# Patient Record
Sex: Female | Born: 1990 | Race: Black or African American | Hispanic: No | Marital: Single | Smoking: Never smoker
Health system: Southern US, Community
[De-identification: ages and names within clinical notes are randomized; demographics above are authoritative.]

## PROBLEM LIST (undated history)

## (undated) DIAGNOSIS — N83209 Unspecified ovarian cyst, unspecified side: Secondary | ICD-10-CM

---

## 2016-01-12 ENCOUNTER — Encounter (HOSPITAL_COMMUNITY): Payer: Self-pay | Admitting: Emergency Medicine

## 2016-01-12 ENCOUNTER — Emergency Department (HOSPITAL_COMMUNITY): Payer: Self-pay

## 2016-01-12 ENCOUNTER — Emergency Department (HOSPITAL_COMMUNITY)
Admission: EM | Admit: 2016-01-12 | Discharge: 2016-01-12 | Disposition: A | Discharge status: Home / Self Care | Payer: Self-pay | Attending: Emergency Medicine | Admitting: Emergency Medicine

## 2016-01-12 DIAGNOSIS — N83291 Other ovarian cyst, right side: Secondary | ICD-10-CM | POA: Insufficient documentation

## 2016-01-12 DIAGNOSIS — N83209 Unspecified ovarian cyst, unspecified side: Secondary | ICD-10-CM

## 2016-01-12 LAB — WET PREP, GENITAL
CLUE CELLS WET PREP: NONE SEEN
SPERM: NONE SEEN
Trich, Wet Prep: NONE SEEN
YEAST WET PREP: NONE SEEN

## 2016-01-12 LAB — COMPREHENSIVE METABOLIC PANEL
ALT: 20 U/L (ref 14–54)
AST: 14 U/L — AB (ref 15–41)
Albumin: 4 g/dL (ref 3.5–5.0)
Alkaline Phosphatase: 71 U/L (ref 38–126)
Anion gap: 8 (ref 5–15)
BUN: 11 mg/dL (ref 6–20)
CHLORIDE: 108 mmol/L (ref 101–111)
CO2: 24 mmol/L (ref 22–32)
Calcium: 9 mg/dL (ref 8.9–10.3)
Creatinine, Ser: 0.64 mg/dL (ref 0.44–1.00)
GFR calc Af Amer: >60 mL/min (ref >60)
Glucose, Bld: 92 mg/dL (ref 65–99)
POTASSIUM: 3.8 mmol/L (ref 3.5–5.1)
Sodium: 140 mmol/L (ref 135–145)
Total Bilirubin: 0.5 mg/dL (ref 0.3–1.2)
Total Protein: 6.5 g/dL (ref 6.5–8.1)

## 2016-01-12 LAB — URINALYSIS, ROUTINE W REFLEX MICROSCOPIC
Bilirubin Urine: NEGATIVE
Glucose, UA: NEGATIVE mg/dL
Ketones, ur: NEGATIVE mg/dL
NITRITE: NEGATIVE
PROTEIN: NEGATIVE mg/dL
Specific Gravity, Urine: 1.009 (ref 1.005–1.030)
pH: 6 (ref 5.0–8.0)

## 2016-01-12 LAB — CBC
HEMATOCRIT: 38.4 % (ref 36.0–46.0)
HEMOGLOBIN: 12.8 g/dL (ref 12.0–15.0)
MCH: 26.6 pg (ref 26.0–34.0)
MCHC: 33.3 g/dL (ref 30.0–36.0)
MCV: 79.8 fL (ref 78.0–100.0)
Platelets: 353 10*3/uL (ref 150–400)
RBC: 4.81 MIL/uL (ref 3.87–5.11)
RDW: 14.7 % (ref 11.5–15.5)
WBC: 12.1 10*3/uL — AB (ref 4.0–10.5)

## 2016-01-12 LAB — RPR: RPR Ser Ql: NONREACTIVE

## 2016-01-12 LAB — HIV ANTIBODY (ROUTINE TESTING W REFLEX): HIV Screen 4th Generation wRfx: NONREACTIVE

## 2016-01-12 LAB — PREGNANCY, URINE: PREG TEST UR: NEGATIVE

## 2016-01-12 MED ORDER — IOPAMIDOL (ISOVUE-300) INJECTION 61%
INTRAVENOUS | Status: AC
  Administered 2016-01-12: 100 mL
  Administered 2016-01-12: 1 mL
  Filled 2016-01-12: qty 100

## 2016-01-12 NOTE — ED Triage Notes (Signed)
Pt is c/o cramping pain in her lower abdomen and on her right side  Pt states the pain is worse when she voids  Pt state pain started yesterday   Denies N/V/D

## 2016-01-12 NOTE — Discharge Instructions (Signed)
[facility]5 days for follow-up. Take Tylenol or ibuprofen as needed for pain relief. Drink plenty of fluids  Get help right away if: You have abdominal pain that is severe or gets worse. You cannot eat or drink without vomiting. You suddenly develop a fever. Your menstrual period is much heavier than usual.

## 2016-01-12 NOTE — ED Notes (Addendum)
Pt performed self swab w/ this writer's verbal assistance.

## 2016-01-12 NOTE — ED Notes (Signed)
Pt unable to tolerate pelvic exam.  Green(large) and white(small) speculums utilized.  Odor and white discharge noted.

## 2016-01-12 NOTE — ED Provider Notes (Signed)
[facility]-EMERGENCY DEPT Provider Note   CSN: [id number] Arrival date & time: 01/12/16  [id number]     History   Chief Complaint Chief Complaint  Patient presents with  . Abdominal Pain    HPI [name] is a 25 y.o. female complains of Suprapubic and right lower quadrant cramping abdominal pain since yesterday. She states that her cramping pain and is intermittent and worsening. Patient states that her pain is about a 5/10. Patient states that she has had a shooting pain that may last 10 seconds. Pain worse when she voids. Patient denies taking anything for pain. She reports last menstrual period was last week on the 12th and no recent sexual activity. Patient admits to vaginal discharge. Patient denies dysuria, urinary symptoms, nausea, vomiting, diarrhea, chest pain, shortness of breath.  Patient denies any abdominal surgeries.   Abdominal Pain   Pertinent negatives include fever, diarrhea, nausea, vomiting, dysuria, hematuria and arthralgias.    History reviewed. No pertinent past medical history.  There are no active problems to display for this patient.   History reviewed. No pertinent surgical history.  OB History    No data available       Home Medications    Prior to Admission medications   Not on File    Family History Family History  Problem Relation Age of Onset  . Cancer Other   . Hypertension Other     Social History Social History  Substance Use Topics  . Smoking status: Never Smoker  . Smokeless tobacco: Never Used  . Alcohol use Yes     Comment: occ     Allergies   Fish allergy   Review of Systems Review of Systems  Constitutional: Negative for chills and fever.  HENT: Negative for sore throat.   Eyes: Negative for pain and visual disturbance.  Respiratory: Negative for cough and shortness of breath.   Cardiovascular: Negative for chest pain and palpitations.  Gastrointestinal: Positive for abdominal pain. Negative for diarrhea, nausea and  vomiting.  Endocrine: Negative for polyuria.  Genitourinary: Negative for difficulty urinating, dysuria and hematuria.  Musculoskeletal: Negative for arthralgias and back pain.  Skin: Negative for color change, rash and wound.     Physical Exam Updated Vital Signs BP 129/87 (BP Location: Right Arm)   Pulse 78   Temp 98.6 F (37 C) (Oral)   Resp 18   Ht 5\' 7"  (1.702 m)   Wt 90.7 kg   LMP 01/01/2016 (Approximate)   SpO2 99%   BMI 31.32 kg/m   Physical Exam  Constitutional: She is oriented to person, place, and time. She appears well-developed and well-nourished.  HENT:  Head: Normocephalic and atraumatic.  Nose: Nose normal.  Eyes: Conjunctivae and EOM are normal. Pupils are equal, round, and reactive to light.  Neck: Normal range of motion. Neck supple.  Cardiovascular: Normal rate and normal heart sounds.   Pulmonary/Chest: Effort normal and breath sounds normal. No respiratory distress. She exhibits no tenderness.  Abdominal: Soft. Bowel sounds are normal. There is tenderness. There is tenderness at McBurney's point (Slight). There is no rebound, no guarding and negative Murphy's sign. Hernia confirmed negative in the right inguinal area and confirmed negative in the left inguinal area.  Soft positive rovsing sign Soft positive obturator sign Negative psoas sign  Genitourinary: Pelvic exam was performed with patient supine. No labial fusion. There is no rash, tenderness, lesion or injury on the right labia. There is no rash, tenderness, lesion or injury on the left labia.  Uterus is tender. Cervix exhibits motion tenderness. Right adnexum displays no mass and no tenderness. Left adnexum displays no mass and no tenderness. There is tenderness in the vagina. No foreign body in the vagina. Vaginal discharge found.  Genitourinary Comments: Attempted pelvic exam with normal speculum and patient was not able to tolerate. Attempted to use smaller speculum and patient still not able to  tolerate exam.  Patient had white vaginal discharge from vagina.  Was able to slightly visualize vaginal canal.  Patient extremely tender during exam. There was some white discharge noted in the vagina and scant blood possibly secondary to her movement during exam.  Patient agreed to attempt to swab swab herself, however these are not the best way for testing and are not clean samples.   Exam chaperoned.   Musculoskeletal: Normal range of motion.  Lymphadenopathy:       Right: No inguinal adenopathy present.       Left: No inguinal adenopathy present.  Neurological: She is alert and oriented to person, place, and time.  Skin: Skin is warm. Capillary refill takes less than 2 seconds. No rash noted. No erythema.  Psychiatric: She has a normal mood and affect. Her behavior is normal.  Nursing note and vitals reviewed.    ED Treatments / Results  Labs (all labs ordered are listed, but only abnormal results are displayed) Labs Reviewed  WET PREP, GENITAL - Abnormal; Notable for the following:       Result Value   WBC, Wet Prep HPF POC FEW (*)    All other components within normal limits  URINALYSIS, ROUTINE W REFLEX MICROSCOPIC - Abnormal; Notable for the following:    Color, Urine STRAW (*)    Hgb urine dipstick LARGE (*)    Leukocytes, UA TRACE (*)    Bacteria, UA RARE (*)    Squamous Epithelial / LPF 0-5 (*)    All other components within normal limits  COMPREHENSIVE METABOLIC PANEL - Abnormal; Notable for the following:    AST 14 (*)    All other components within normal limits  CBC - Abnormal; Notable for the following:    WBC 12.1 (*)    All other components within normal limits  PREGNANCY, URINE  RPR  HIV ANTIBODY (ROUTINE TESTING)  GC/CHLAMYDIA PROBE AMP ([place]) NOT AT [facility]    EKG  EKG Interpretation None       Radiology US Pelvis Complete  Result Date: 01/12/2016 CLINICAL DATA:  Acute onset right pelvic pain for 1 day. Clinical suspicion for ovarian  torsion. EXAM: TRANSABDOMINAL ULTRASOUND OF PELVIS DOPPLER ULTRASOUND OF OVARIES TECHNIQUE: Transabdominal ultrasound examination of the pelvis was performed including evaluation of the uterus, ovaries, adnexal regions, and pelvic cul-de-sac. Color and duplex Doppler ultrasound was utilized to evaluate blood flow to the ovaries. Patient refused transvaginal sonography. COMPARISON:  None. FINDINGS: Uterus Measurements: 7.4 x 3.4 x 4.4 cm. No fibroids or other mass visualized. Endometrium Thickness: 10 mm transabdominally. No focal abnormality visualized although evaluation is limited on this transabdominal only exam. Right ovary Measurements: 5.8 x 4.8 x 5.7 cm. A hypoechoic lesion with diffuse low level internal echoes and increased through transmission is seen which measures 5.0 x 3.9 x 4.6 cm. This lesion shows no internal blood flow, although some blood flow is seen within adjacent normal ovarian tissue on color Doppler ultrasound. Left ovary Measurements: 3.4 x 2.2 x 2.7 cm. Normal appearance/no adnexal mass. Pulsed Doppler evaluation demonstrates normal low-resistance arterial and venous waveforms in both  ovaries. IMPRESSION: 5 cm complex cystic lesion in right ovary, which has indeterminate but probably benign characteristics. Differential diagnosis includes hemorrhagic ovarian cyst, endometrioma, and cystic ovarian neoplasm. Recommend followup by pelvic ultrasound in 6-12 weeks. This recommendation follows the consensus statement: Management of Asymptomatic Ovarian and Other Adnexal Cysts Imaged at US: Society of Radiologists in Ultrasound Consensus Conference Statement. Radiology 2010; [phone]. No sonographic evidence for ovarian torsion. Normal appearance of uterus and left ovary. Electronically Signed   By: Myles RosenthalJohn  Stahl M.D.   On: 01/12/2016 14:03   Ct Abdomen Pelvis W Contrast  Result Date: 01/12/2016 CLINICAL DATA:  Right lower quadrant pain starting yesterday EXAM: CT ABDOMEN AND PELVIS WITH  CONTRAST TECHNIQUE: Multidetector CT imaging of the abdomen and pelvis was performed using the standard protocol following bolus administration of intravenous contrast. CONTRAST:  1 ISOVUE-300 IOPAMIDOL (ISOVUE-300) INJECTION 61% COMPARISON:  None. FINDINGS: Lower chest:  No contributory findings. Hepatobiliary: No focal liver abnormality.No evidence of biliary obstruction or stone. Pancreas: Unremarkable. Spleen: Unremarkable. Adrenals/Urinary Tract: Negative adrenals. No hydronephrosis or stone. Unremarkable bladder. Stomach/Bowel:  No obstruction. No appendicitis. Vascular/Lymphatic: No acute vascular abnormality. Narrowing of the celiac axis which is likely from median arcuate ligament. No hypertrophied peripancreatic arterial collaterals. No mass or adenopathy. Reproductive:37 mm right adnexal cyst which has density greater than water. Along the inferior margin of this cyst are strandy high-density areas which could be intravascular or clot. Neighboring ovarian parenchyma is not particularly edematous. Other: Small pelvic fluid which may be from cyst leakage. Musculoskeletal: No acute abnormalities. IMPRESSION: 1. 4 cm right ovarian cyst. Cyst complexity is likely from hemorrhage but pelvic ultrasound follow-up is recommended. 2. Small pelvic fluid implying cyst leakage. 3. Normal appendix. Electronically Signed   By: Marnee SpringJonathon  Watts M.D.   On: 01/12/2016 11:40   [place] Art/ven Flow Abd Pelv Doppler  Result Date: 01/12/2016 CLINICAL DATA:  Acute onset right pelvic pain for 1 day. Clinical suspicion for ovarian torsion. EXAM: TRANSABDOMINAL ULTRASOUND OF PELVIS DOPPLER ULTRASOUND OF OVARIES TECHNIQUE: Transabdominal ultrasound examination of the pelvis was performed including evaluation of the uterus, ovaries, adnexal regions, and pelvic cul-de-sac. Color and duplex Doppler ultrasound was utilized to evaluate blood flow to the ovaries. Patient refused transvaginal sonography. COMPARISON:  None. FINDINGS: Uterus  Measurements: 7.4 x 3.4 x 4.4 cm. No fibroids or other mass visualized. Endometrium Thickness: 10 mm transabdominally. No focal abnormality visualized although evaluation is limited on this transabdominal only exam. Right ovary Measurements: 5.8 x 4.8 x 5.7 cm. A hypoechoic lesion with diffuse low level internal echoes and increased through transmission is seen which measures 5.0 x 3.9 x 4.6 cm. This lesion shows no internal blood flow, although some blood flow is seen within adjacent normal ovarian tissue on color Doppler ultrasound. Left ovary Measurements: 3.4 x 2.2 x 2.7 cm. Normal appearance/no adnexal mass. Pulsed Doppler evaluation demonstrates normal low-resistance arterial and venous waveforms in both ovaries. IMPRESSION: 5 cm complex cystic lesion in right ovary, which has indeterminate but probably benign characteristics. Differential diagnosis includes hemorrhagic ovarian cyst, endometrioma, and cystic ovarian neoplasm. Recommend followup by pelvic ultrasound in 6-12 weeks. This recommendation follows the consensus statement: Management of Asymptomatic Ovarian and Other Adnexal Cysts Imaged at US: Society of Radiologists in Ultrasound Consensus Conference Statement. Radiology 2010; [phone]:[id number]. No sonographic evidence for ovarian torsion. Normal appearance of uterus and left ovary. Electronically Signed   By: Myles RosenthalJohn  Stahl M.D.   On: 01/12/2016 14:03    Procedures Procedures (including critical care time)  Medications  Ordered in ED Medications  iopamidol (ISOVUE-300) 61 % injection (1 mL  Contrast Given 01/12/16 1129)     Initial Impression / Assessment and Plan / ED Course  I have reviewed the triage vital signs and the nursing notes.  Pertinent labs & imaging results that were available during my care of the patient were reviewed by me and considered in my medical decision making (see chart for details).  Clinical Course   Patient is a 25 year old female presenting with RLQ pain since  yesterday. On exam patient afebrile, vital signs stable, in no apparent distress. Heart and lung sounds clear. Abdomen soft, slightly tender to right lower quadrant. Soft positive Rovsing sign, soft positive obturator sign. Negative psoas sign. Attempted pelvic exam with normal speculum and patient was not able to tolerate. Attempted to use smaller speculum and patient still not able to tolerate exam. Patient had white vaginal discharge from vagina. Was able to slightly visualize vaginal canal. Patient extremely tender during exam. There was some white discharge noted in the vagina and slight blood possibly secondary to her movement during exam. Patient agreed to attempt to swab swab herself, however these are not the best way for testing and are not clean samples.  History and physical on patient not totally convincing for appendicitis.  Will order Ct. CT shows a 4 cm right ovarian cyst and small pelvic fluid implying cyst leakage. Normal appendix. Follow-up ultrasound shows right ovarian cyst of 5 cm, no evidence of ovarian torsion, and normal left ovary. Increase in WBC not concerning for infection at this time. Patient is afebrile, VSS, and in NAD. Patient given strict instructions to follow-up with [facility] [place] today. Return percussion is given for any new or worsening symptoms such as fevers, chills, shortness of breath, chest pain, excessive bleeding, or bleeding between periods. Patient also seen and evaluated by Dr. Jacqulyn Bath.   Final Clinical Impressions(s) / ED Diagnoses   Final diagnoses:  Ruptured ovarian cyst    New Prescriptions There are no discharge medications for this patient.    [address] [place], [place] 01/12/16 1557    Maia Plan, MD 01/12/16 1958

## 2016-01-13 LAB — GC/CHLAMYDIA PROBE AMP (~~LOC~~) NOT AT ARMC
CHLAMYDIA, DNA PROBE: NEGATIVE
Neisseria Gonorrhea: NEGATIVE

## 2016-02-22 ENCOUNTER — Ambulatory Visit (INDEPENDENT_AMBULATORY_CARE_PROVIDER_SITE_OTHER): Payer: Self-pay | Admitting: Obstetrics & Gynecology

## 2016-02-22 ENCOUNTER — Encounter: Payer: Self-pay | Admitting: Obstetrics & Gynecology

## 2016-02-22 VITALS — BP 130/77 | HR 60 | Wt 196.0 lb

## 2016-02-22 DIAGNOSIS — N83201 Unspecified ovarian cyst, right side: Secondary | ICD-10-CM

## 2016-02-22 NOTE — Progress Notes (Signed)
Pt given Free Pap Screening number US scheduled for February 12th @ 1545.  Pt notified.

## 2016-02-22 NOTE — Progress Notes (Signed)
History:  26 y.o. here today for eval of pelvic pain. G0. LMP 1/12-1/167.  Pt is not sexually active. Was seen in the ED and noted to have a cyst in her right ovary. Had some pain with her cycle last month but, has noted no other changes.    The following portions of the patient's history were reviewed and updated as appropriate: allergies, current medications, past family history, past medical history, past social history, past surgical history and problem list.  Review of Systems:  Pertinent items are noted in HPI.   Objective:  Physical Exam Blood pressure 130/77, pulse 60, weight 196 lb (88.9 kg), last menstrual period 01/27/2016. Gen: NAD Lungs: CTA CV: RR  Abd: Soft, nontender and nondistended Pelvic: Normal appearing external genitalia; normal appearing vaginal mucosa and cervix.  Normal discharge.  Small uterus, no other palpable masses, no uterine or adnexal tenderness  Labs and Imaging 01/12/2016 CLINICAL DATA:  Acute onset right pelvic pain for 1 day. Clinical suspicion for ovarian torsion.  EXAM: TRANSABDOMINAL ULTRASOUND OF PELVIS  DOPPLER ULTRASOUND OF OVARIES  TECHNIQUE: Transabdominal ultrasound examination of the pelvis was performed including evaluation of the uterus, ovaries, adnexal regions, and pelvic cul-de-sac.  Color and duplex Doppler ultrasound was utilized to evaluate blood flow to the ovaries. Patient refused transvaginal sonography.  COMPARISON:  None.  FINDINGS: Uterus  Measurements: 7.4 x 3.4 x 4.4 cm. No fibroids or other mass visualized.  Endometrium  Thickness: 10 mm transabdominally. No focal abnormality visualized although evaluation is limited on this transabdominal only exam.  Right ovary  Measurements: 5.8 x 4.8 x 5.7 cm. A hypoechoic lesion with diffuse low level internal echoes and increased through transmission is seen which measures 5.0 x 3.9 x 4.6 cm. This lesion shows no internal blood flow, although some  blood flow is seen within adjacent normal ovarian tissue on color Doppler ultrasound.  Left ovary  Measurements: 3.4 x 2.2 x 2.7 cm. Normal appearance/no adnexal mass.  Pulsed Doppler evaluation demonstrates normal low-resistance arterial and venous waveforms in both ovaries.  IMPRESSION: 5 cm complex cystic lesion in right ovary, which has indeterminate but probably benign characteristics. Differential diagnosis includes hemorrhagic ovarian cyst, endometrioma, and cystic ovarian neoplasm. Recommend followup by pelvic ultrasound in 6-12 weeks. This recommendation follows the consensus statement: Management of Asymptomatic Ovarian and Other Adnexal Cysts Imaged at US: Society of Radiologists in Ultrasound Consensus Conference Statement. Radiology 2010; [phone].  No sonographic evidence for ovarian torsion.  Normal appearance of uterus and left ovary.   Assessment & Plan:  Right ov cyst  rec repeat US to check for resolution F/u prn  Janet Navarro L. Harraway-Smith, M.D., [facility] CoreFACOG

## 2016-02-22 NOTE — Patient Instructions (Signed)
Ovarian Cyst  An ovarian cyst is a fluid-filled sac that forms on an ovary. The ovaries are small organs that produce eggs in women. Various types of cysts can form on the ovaries. Some may cause symptoms and require treatment. Most ovarian cysts go away on their own, are not cancerous (are benign), and do not cause problems. Common types of ovarian cysts include:  Functional (follicle) cysts.  Occur during the menstrual cycle, and usually go away with the next menstrual cycle if you do not get pregnant.  Usually cause no symptoms.  Endometriomas.  Are cysts that form from the tissue that lines the uterus (endometrium).  Are sometimes called "chocolate cysts" because they become filled with blood that turns brown.  Can cause pain in the lower abdomen during intercourse and during your period.  Cystadenoma cysts.  Develop from cells on the outside surface of the ovary.  Can get very large and cause lower abdomen pain and pain with intercourse.  Can cause severe pain if they twist or break open (rupture).  Dermoid cysts.  Are sometimes found in both ovaries.  May contain different kinds of body tissue, such as skin, teeth, hair, or cartilage.  Usually do not cause symptoms unless they get very big.  Theca lutein cysts.  Occur when too much of a certain hormone (human chorionic gonadotropin) is produced and overstimulates the ovaries to produce an egg.  Are most common after having procedures used to assist with the conception of a baby (in vitro fertilization). What are the causes? Ovarian cysts may be caused by:  Ovarian hyperstimulation syndrome. This is a condition that can develop from taking fertility medicines. It causes multiple large ovarian cysts to form.  Polycystic ovarian syndrome (PCOS). This is a common hormonal disorder that can cause ovarian cysts, as well as problems with your period or fertility. What increases the risk? The following factors may make you  more likely to develop ovarian cysts:  Being overweight or obese.  Taking fertility medicines.  Taking certain forms of hormonal birth control.  Smoking. What are the signs or symptoms? Many ovarian cysts do not cause symptoms. If symptoms are present, they may include:  Pelvic pain or pressure.  Pain in the lower abdomen.  Pain during sex.  Abdominal swelling.  Abnormal menstrual periods.  Increasing pain with menstrual periods. How is this diagnosed? These cysts are commonly found during a routine pelvic exam. You may have tests to find out more about the cyst, such as:  Ultrasound.  X-ray of the pelvis.  CT scan.  MRI.  Blood tests. How is this treated? Many ovarian cysts go away on their own without treatment. Your health care provider may want to check your cyst regularly for 2-3 months to see if it changes. If you are in menopause, it is especially important to have your cyst monitored closely because menopausal women have a higher rate of ovarian cancer. When treatment is needed, it may include:  Medicines to help relieve pain.  A procedure to drain the cyst (aspiration).  Surgery to remove the whole cyst.  Hormone treatment or birth control pills. These methods are sometimes used to help dissolve a cyst. Follow these instructions at home:  Take over-the-counter and prescription medicines only as told by your health care provider.  Do not drive or use heavy machinery while taking prescription pain medicine.  Get regular pelvic exams and Pap tests as often as told by your health care provider.  Return to your   normal activities as told by your health care provider. Ask your health care provider what activities are safe for you.  Do not use any products that contain nicotine or tobacco, such as cigarettes and e-cigarettes. If you need help quitting, ask your health care provider.  Keep all follow-up visits as told by your health care provider. This is  important. Contact a health care provider if:  Your periods are late, irregular, or painful, or they stop.  You have pelvic pain that does not go away.  You have pressure on your bladder or trouble emptying your bladder completely.  You have pain during sex.  You have any of the following in your abdomen:  A feeling of fullness.  Pressure.  Discomfort.  Pain that does not go away.  Swelling.  You feel generally ill.  You become constipated.  You lose your appetite.  You develop severe acne.  You start to have more body hair and facial hair.  You are gaining weight or losing weight without changing your exercise and eating habits.  You think you may be pregnant. Get help right away if:  You have abdominal pain that is severe or gets worse.  You cannot eat or drink without vomiting.  You suddenly develop a fever.  Your menstrual period is much heavier than usual. This information is not intended to replace advice given to you by your health care provider. Make sure you discuss any questions you have with your health care provider. Document Released: 01/01/2005 Document Revised: 07/22/2015 Document Reviewed: 06/05/2015 Elsevier Interactive Patient Education  2017 [employer]  

## 2016-02-27 ENCOUNTER — Ambulatory Visit (HOSPITAL_COMMUNITY)
Admission: RE | Admit: 2016-02-27 | Discharge: 2016-02-27 | Disposition: A | Discharge status: Home / Self Care | Payer: Self-pay | Source: Ambulatory Visit | Attending: Obstetrics & Gynecology | Admitting: Obstetrics & Gynecology

## 2016-02-27 DIAGNOSIS — N83201 Unspecified ovarian cyst, right side: Secondary | ICD-10-CM

## 2016-04-09 ENCOUNTER — Telehealth: Payer: Self-pay | Admitting: Obstetrics & Gynecology

## 2016-04-09 NOTE — Telephone Encounter (Signed)
TC to pt to discuss US results from 02/2016. LMTCB.  Adaijah Endres L. Harraway-Smith, M.D., [facility] CoreFACOG

## 2016-04-18 ENCOUNTER — Telehealth: Payer: Self-pay | Admitting: Obstetrics & Gynecology

## 2016-04-18 DIAGNOSIS — N83201 Unspecified ovarian cyst, right side: Secondary | ICD-10-CM

## 2016-04-18 NOTE — Telephone Encounter (Signed)
Pt returned TC.  I have reviewed with her the US done in Feb. Pt reports rare discomfort. On no meds for this. Discussed tx options including repeat US vs surgery.  Pt opts to have a repeat US  3 months from the last [place] and if cyst remains present rec removal.  CLINICAL DATA:  Right ovarian cyst.  EXAM: TRANSABDOMINAL ULTRASOUND OF PELVIS  TECHNIQUE: Transabdominal ultrasound examination of the pelvis was performed including evaluation of the uterus, ovaries, adnexal regions, and pelvic cul-de-sac. Patient refused transvaginal exam.  COMPARISON:  01/12/2016.  FINDINGS: Uterus  Measurements: 7.3 x 3.5 x 4.2 cm. No fibroids or other mass visualized.  Endometrium  Thickness: 8 mm.  No focal abnormality visualized.  Right ovary  Measurements: 6.2 x 4.9 x 5.5 cm. 5.0 x 4.5 x 4.6 cm complex mass right ovary again noted. Slight interim increase in size. Gynecologic evaluation suggested. Again differential diagnosis includes hemorrhagic ovarian cyst, endometrioma, cystic/solid ovarian neoplasm. Pregnancy test suggested to exclude ectopic pregnancy.  Left ovary  Measurements: 2.5 x 1.6 x 1.3 cm. Normal appearance/no adnexal mass.  Other findings:  Trace free pelvic fluid.  IMPRESSION: Persistent 5 x 4.5 x 4.6 cm complex mass right ovary. Slight interim increase in size. Again differential diagnosis includes hemorrhagic ovarian cyst, endometrioma, cystic/solid ovarian neoplasm. Pregnancy test to exclude ectopic pregnancy suggested. Gynecologic evaluation should be considered.  Pelvic US ordered f/u after [place] or sooner prn  Thoams Siefert L. Harraway-Smith, M.D., [place]

## 2016-04-18 NOTE — Telephone Encounter (Signed)
TC to pt.  Left message. Requested that she cll back and leave a time when I can best reach her.  Kleo Dungee L. Harraway-Smith, M.D., [place] Core

## 2016-04-24 ENCOUNTER — Telehealth: Payer: Self-pay

## 2016-04-24 DIAGNOSIS — R102 Pelvic and perineal pain: Secondary | ICD-10-CM

## 2016-04-24 NOTE — Telephone Encounter (Signed)
Call patient an informed her of her results.

## 2016-04-24 NOTE — Telephone Encounter (Signed)
u/s scheduled for 06/06/2016@ 9:00. I have left patient a detailed message regarding appointment in May.

## 2016-04-25 ENCOUNTER — Telehealth: Payer: Self-pay | Admitting: General Practice

## 2016-05-30 ENCOUNTER — Encounter (HOSPITAL_COMMUNITY): Payer: Self-pay

## 2016-05-30 ENCOUNTER — Emergency Department (HOSPITAL_COMMUNITY)
Admission: EM | Admit: 2016-05-30 | Discharge: 2016-05-30 | Disposition: A | Discharge status: Home / Self Care | Payer: Self-pay | Attending: Emergency Medicine | Admitting: Emergency Medicine

## 2016-05-30 DIAGNOSIS — R1031 Right lower quadrant pain: Secondary | ICD-10-CM | POA: Insufficient documentation

## 2016-05-30 DIAGNOSIS — R109 Unspecified abdominal pain: Secondary | ICD-10-CM

## 2016-05-30 NOTE — ED Triage Notes (Signed)
Pt complains of flank pain due to an ovarian cyst that she was dx with in Dec Pt denies andy bleeding

## 2016-05-30 NOTE — ED Provider Notes (Signed)
[facility]-EMERGENCY DEPT Provider Note   CSN: [id number] Arrival date & time: 05/30/16  1847     History   Chief Complaint Chief Complaint  Patient presents with  . Ovarian Cyst    HPI Janet Navarro is a 26 y.o. female.  The history is provided by the patient.  Abdominal Pain   This is a recurrent (when she first found out about the cyst) problem. The current episode started 6 to 12 hours ago. Episode frequency: intermittent. The problem has been resolved. Associated with: h/o right ovarian cyst. The pain is located in the RLQ. The pain is moderate. Pertinent negatives include diarrhea, melena, nausea, vomiting and constipation. Exacerbated by: a full bladder. Relieved by: voiding.   She denies any dysuria, frequency, urgency. Denies any vaginal bleeding or discharge. Denies any prior sexual intercourse.   Denies any other physical complaints.   History reviewed. No pertinent past medical history.  There are no active problems to display for this patient.   History reviewed. No pertinent surgical history.  OB History    No data available       Home Medications    Prior to Admission medications   Not on File    Family History Family History  Problem Relation Age of Onset  . Cancer Other   . Hypertension Other     Social History Social History  Substance Use Topics  . Smoking status: Never Smoker  . Smokeless tobacco: Never Used  . Alcohol use Yes     Comment: occ     Allergies   Fish allergy   Review of Systems Review of Systems  Gastrointestinal: Positive for abdominal pain. Negative for constipation, diarrhea, melena, nausea and vomiting.   All other systems are reviewed and are negative for acute change except as noted in the HPI   Physical Exam Updated Vital Signs BP 126/72 (BP Location: Left Arm)   Pulse 71   Temp 98.7 F (37.1 C) (Oral)   Resp 16   Wt 167 lb (75.8 kg)   LMP 05/10/2016   SpO2 100%   BMI 26.16 kg/m   Physical Exam    Constitutional: She is oriented to person, place, and time. She appears well-developed and well-nourished. No distress.  HENT:  Head: Normocephalic and atraumatic.  Nose: Nose normal.  Eyes: Conjunctivae and EOM are normal. Pupils are equal, round, and reactive to light. Right eye exhibits no discharge. Left eye exhibits no discharge. No scleral icterus.  Neck: Normal range of motion. Neck supple.  Cardiovascular: Normal rate and regular rhythm.  Exam reveals no gallop and no friction rub.   No murmur heard. Pulmonary/Chest: Effort normal and breath sounds normal. No stridor. No respiratory distress. She has no rales.  Abdominal: Soft. She exhibits no distension. There is no tenderness. There is no rigidity, no rebound, no guarding, no CVA tenderness, no tenderness at McBurney's point and negative Murphy's sign.  Musculoskeletal: She exhibits no edema or tenderness.  Neurological: She is alert and oriented to person, place, and time.  Skin: Skin is warm and dry. No rash noted. She is not diaphoretic. No erythema.  Psychiatric: She has a normal mood and affect.  Vitals reviewed.    ED Treatments / Results  Labs (all labs ordered are listed, but only abnormal results are displayed) Labs Reviewed - No data to display  EKG  EKG Interpretation None       Radiology No results found.  Procedures Procedures (including critical care time)  Medications Ordered  in ED Medications - No data to display   Initial Impression / Assessment and Plan / ED Course  I have reviewed the triage vital signs and the nursing notes.  Pertinent labs & imaging results that were available during my care of the patient were reviewed by me and considered in my medical decision making (see chart for details).     Benign abdominal exam. Patient with a history of right ovarian hemorrhagic cyst. Symptoms are not consistent with ovarian torsion. Patient is denied any urinary symptoms that would be  concerning for urinary tract infection. She denies any prior sexual intercourse so low suspicion for ectopic pregnancy or cervicitis/PID. Low suspicion for serious intra-abdominal inflammatory/infectious process such as appendicitis.   Patient and I discussed at length her symptoms and the appropriate diagnosis studies however which are decision-making, she decided to refrain from obtaining any laboratory studies or ultrasound.   Given her benign exam show that this is reasonable course of action.  Patient was given specific strict return precautions.   Safe for discharge.  Final Clinical Impressions(s) / ED Diagnoses   Final diagnoses:  Abdominal cramping   Disposition: Discharge  Condition: Good  I have discussed Dx and Tx plan with the patient who expressed understanding and agree(s) with the plan. Discharge instructions discussed at great length. The patient was given strict return precautions who verbalized understanding of the instructions. No further questions at time of discharge.    There are no discharge medications for this patient.   [facility] [address] [place] [id number] [phone]  as scheduled       Nira ConnCardama, Blinda Turek Eduardo, MD 05/30/16 2222

## 2016-06-06 ENCOUNTER — Other Ambulatory Visit: Payer: Self-pay | Admitting: Obstetrics & Gynecology

## 2016-06-06 ENCOUNTER — Ambulatory Visit (HOSPITAL_COMMUNITY)
Admission: RE | Admit: 2016-06-06 | Discharge: 2016-06-06 | Disposition: A | Discharge status: Home / Self Care | Payer: Self-pay | Source: Ambulatory Visit | Attending: Obstetrics & Gynecology | Admitting: Obstetrics & Gynecology

## 2016-06-06 DIAGNOSIS — R102 Pelvic and perineal pain: Secondary | ICD-10-CM

## 2016-06-06 DIAGNOSIS — N839 Noninflammatory disorder of ovary, fallopian tube and broad ligament, unspecified: Secondary | ICD-10-CM | POA: Insufficient documentation

## 2016-06-13 ENCOUNTER — Encounter: Payer: Self-pay | Admitting: Obstetrics & Gynecology

## 2016-06-13 ENCOUNTER — Ambulatory Visit (INDEPENDENT_AMBULATORY_CARE_PROVIDER_SITE_OTHER): Payer: Self-pay | Admitting: Obstetrics & Gynecology

## 2016-06-13 VITALS — BP 131/71 | HR 54 | Ht 67.0 in | Wt 185.8 lb

## 2016-06-13 DIAGNOSIS — N83201 Unspecified ovarian cyst, right side: Secondary | ICD-10-CM

## 2016-06-13 NOTE — Patient Instructions (Signed)
Ovarian Cystectomy Ovarian cystectomy is surgery to remove a fluid-filled sac (cyst) on an ovary. The ovaries are small organs that produce eggs in women. Various types of cysts can form on the ovaries. Most are not cancerous. Surgery may be done if a cyst is large or is causing symptoms such as pain. It may also be done for a cyst that is or might be cancerous. This surgery can be done using a laparoscopic technique or an open abdominal technique. The laparoscopic technique involves smaller cuts (incisions) and a faster recovery time. The technique used will depend on your age, the type of cyst, and whether the cyst is cancerous. The laparoscopic technique is not used for a cancerous cyst. LET [facility] CARE PROVIDER KNOW ABOUT:  Any allergies you have.  All medicines you are taking, including vitamins, herbs, eye drops, creams, and over-the-counter medicines.  Previous problems you or members of your family have had with the use of anesthetics.  Any blood disorders you have.  Previous surgeries you have had.  Medical conditions you have.  Any chance you might be pregnant. RISKS AND COMPLICATIONS Generally, this is a safe procedure. However, as with any procedure, complications can occur. Possible complications include:  Excessive bleeding.  Infection.  Injury to other organs.  Blood clots.  Becoming incapable of getting pregnant (infertile). BEFORE THE PROCEDURE  Ask your health care provider about changing or stopping any regular medicines. Avoid taking aspirin, ibuprofen, or blood thinners as directed by your health care provider.  Do not eat or drink anything after midnight the night before surgery.  If you smoke, do not smoke for at least 2 weeks before your surgery.  Do not drink alcohol the day before your surgery.  Let your health care provider know if you develop a cold or any infection before your surgery.  Arrange for someone to drive you home after the  procedure or after your hospital stay. Also arrange for someone to help you with activities during recovery. PROCEDURE Either a laparoscopic technique or an open abdominal technique may be used for this surgery.  Small monitors will be put on your body. They are used to check your heart, blood pressure, and oxygen level.  An IV access tube will be put into one of your veins. Medicine will be able to flow directly into your body through this IV tube.  You might be given a medicine to help you relax (sedative).  You will be given a medicine to make you sleep (general anesthetic). A breathing tube may be placed into your lungs during the procedure. Laparoscopic Technique   Several small cuts (incisions) are made in your abdomen. These are typically about 1 to 2 cm long.  Your abdomen will be filled with carbon dioxide gas so that it expands. This gives the surgeon more room to operate and makes your organs easier to see.  A thin, lighted tube with a tiny camera on the end (laparoscope) is put through one of the small incisions. The camera on the laparoscope sends a picture to a TV screen in the operating room. This gives the surgeon a good view inside your abdomen.  Hollow tubes are put through the other small incisions in your abdomen. The tools needed for the procedure are put through these tubes.  The ovary with the cyst is identified, and the cyst is removed. It is sent to the lab for testing. If it is cancer, both ovaries may need to be removed during a  different surgery.  Tools are removed. The incisions are then closed with stitches or skin glue, and dressings may be applied. Open Abdominal Technique   A single large incision is made along your bikini line or in the middle of your lower abdomen.  The ovary with the cyst is identified, and the cyst is removed. It is sent to the lab for testing. If it is cancer, both ovaries may need to be removed during a different surgery.  The  incision is then closed with stitches or staples. What to expect after the procedure  You will wake up from anesthesia and be taken to a recovery area.  If you had laparoscopic surgery, you may be able to go home the same day, or you may need to stay in the hospital overnight.  If you had open abdominal surgery, you will need to stay in the hospital for a few days.  Your IV access tube and catheter will be removed the first or second day, after you are able to eat and drink enough.  You may be given medicine to relieve pain or to help you sleep.  You may be given an antibiotic medicine if needed. This information is not intended to replace advice given to you by your health care provider. Make sure you discuss any questions you have with your health care provider. Document Released: 10/29/2006 Document Revised: 06/09/2015 Document Reviewed: 08/13/2012 Elsevier Interactive Patient Education  2017 [employer]  Ovarian Cystectomy, Care After Refer to this sheet in the next few weeks. These instructions provide you with information on caring for yourself after your procedure. Your health care provider may also give you more specific instructions. Your treatment has been planned according to current medical practices, but problems sometimes occur. Call your health care provider if you have any problems or questions after your procedure. What can I expect after the procedure? After your procedure, it is typical to have the following:  Pain in your abdomen, especially at the incision sites. You will be given pain medicines to control the pain.  Tiredness. This is a normal part of the recovery process. Your energy level will return to normal over the next several weeks.  Constipation. Follow these instructions at home:  Only take over-the-counter or prescription medicines as directed by your health care provider. Avoid taking aspirin because it can cause bleeding.  Follow your health care  provider's instructions for when to resume your regular diet, exercise, and activities.  Take rest breaks during the day as needed.  Do not douche or have sexual intercourse until you have permission from your health care provider.  Remove or change any bandages (dressings) as directed by your health care provider.  Do not drive until your health care provider approves.  Take showers instead of baths until your health care provider tells you otherwise.  If you become constipated, you may:  Use a mild laxative if your health care provider approves.  Add more fruit and bran to your diet.  Drink more fluids.  Take your temperature twice a day and record it.  Do not drink alcohol while taking pain medicine.  Try to have someone home with you for the first 1-2 weeks to help with your household activities.  Follow up with your health care provider as directed. Contact a health care provider if:  You have a fever.  You feel sick to your stomach (nauseous) and throw up (vomit).  You have redness, swelling, or leakage of fluid at  the incision site.  You have pain when you urinate or have blood in your urine.  You have a rash on your body.  You have pain or redness where the IV tube was inserted.  You have pain that is not relieved with medicine. Get help right away if:  You have chest pain or shortness of breath.  You feel dizzy or lightheaded.  You have increasing abdominal pain that is not relieved with medicines.  You have pain, swelling, or redness in your leg.  You see a yellowish white fluid (pus) coming from the incision.  Your incision is opening (edges not staying together). This information is not intended to replace advice given to you by your health care provider. Make sure you discuss any questions you have with your health care provider. Document Released: 10/22/2012 Document Revised: 06/09/2015 Document Reviewed: 08/13/2012 Elsevier Interactive Patient  Education  2017 [employer]

## 2016-06-13 NOTE — Progress Notes (Signed)
History:  26 y.o. No obstetric history on file. here today for f/u of right ov cyst. Pt reports that she is not having sx currently but, was recently seen in the ED with recurrent pain. She denies constitutional sx.  The following portions of the patient's history were reviewed and updated as appropriate: allergies, current medications, past family history, past medical history, past social history, past surgical history and problem list.  Review of Systems:  Pertinent items are noted in HPI.   Objective:  Physical Exam Blood pressure 131/71, pulse (!) 54, height 5\' 7"  (1.702 m), weight 185 lb 12.8 oz (84.3 kg), last menstrual period 05/12/2016.  CONSTITUTIONAL: Well-developed, well-nourished female in no acute distress.  HENT:  Normocephalic, atraumatic EYES: Conjunctivae and EOM are normal. No scleral icterus.  NECK: Normal range of motion SKIN: Skin is warm and dry. No rash noted. Not diaphoretic.No pallor. NEUROLGIC: Alert and oriented to person, place, and time. Normal coordination.   Labs and Imaging US Pelvis Complete  Result Date: 06/06/2016 CLINICAL DATA:  Follow-up ovarian cysts EXAM: TRANSABDOMINAL ULTRASOUND OF PELVIS TECHNIQUE: Transabdominal ultrasound examination of the pelvis was performed including evaluation of the uterus, ovaries, adnexal regions, and pelvic cul-de-sac. COMPARISON:  02/27/2016 FINDINGS: Uterus Measurements: 6.6 x 3.5 x 4.4 cm. No fibroids or other mass visualized. Endometrium Thickness: 9 mm in thickness.  No focal abnormality visualized. Right ovary Measurements: 5.3 x 5.0 x 5.2 cm complex hypoechoic area within the right ovary a again noted measuring 4.6 x 4.1 x 3.8 cm compared to 5.0 x 4.6 x 4.5 cm previously. Normal appearance/no adnexal mass. Left ovary Measurements: 3.7 x 2.3 x 2.7 cm. Normal appearance/no adnexal mass. Other findings:  No abnormal free fluid. IMPRESSION: Hypoechoic mass within the right ovary is again noted, slightly smaller in size,  now measuring 4.6 x 4.1 x 3.8 cm. This most likely reflects hemorrhagic cyst or endometrioma. Continued follow-up is recommended. Electronically Signed   By: Charlett Nose M.D.   On: 06/06/2016 10:53    Assessment & Plan:  Persistent right ov cyst. NO symptomatic at present but has led to ED visit due to pain  Patient desires surgical management with right ovarian cystectomy.  The risks of surgery were discussed in detail with the patient including but not limited to: bleeding which may require transfusion or reoperation; infection which may require prolonged hospitalization or re-hospitalization and antibiotic therapy; injury to bowel, bladder, ureters and major vessels or other surrounding organs; need for additional procedures including laparotomy; thromboembolic phenomenon, incisional problems and other postoperative or anesthesia complications.  Patient was told that the likelihood that her condition and symptoms will be treated effectively with this surgical management was very high; the postoperative expectations were also discussed in detail. The patient also understands the alternative treatment options which were discussed in full. All questions were answered.  She was told that she will be contacted by our surgical scheduler regarding the time and date of her surgery; routine preoperative instructions of having nothing to eat or drink after midnight on the day prior to surgery and also coming to the hospital 1 1/2 hours prior to her time of surgery were also emphasized.  She was told she may be called for a preoperative appointment about a week prior to surgery and will be given further preoperative instructions at that visit. Printed patient education handouts about the procedure were given to the patient to review at home.  Pt needs to complete the financial iad paperwork provided for her.  She reports taht the paperwork has been completed but needs to be sent.  Want surgery in Aug due to  schedule.  Total face-to-face time with patient was 15 min.  Greater than 50% was spent in counseling and coordination of care with the patient.   Chari Parmenter L. Harraway-Smith, M.D., Evern CoreFACOG

## 2016-06-27 ENCOUNTER — Encounter (HOSPITAL_COMMUNITY): Payer: Self-pay

## 2016-08-10 NOTE — Patient Instructions (Addendum)
Your procedure is scheduled on:  Tuesday, August 14  [facility] through [facility] at: 8 am  Pick up the phone at the desk and dial [phone].  Call this number if you have problems the morning of surgery: [phone].  Remember: Do NOT eat or drink clear liquids (including water) after Midnight Monday  Take these medicines the morning of surgery with a SIP OF WATER:  None  Do Not smoke on the day of surgery.   Do NOT wear jewelry (body piercing), metal hair clips/bobby pins, make-up, or nail polish. Do NOT wear lotions, powders, or perfumes.  You may wear deoderant. Do NOT shave for 48 hours prior to surgery. Do NOT bring valuables to the hospital. Contacts, dentures, or bridgework may not be worn into surgery.  Have a responsible adult drive you home and stay with you for 24 hours after your procedure.  Home with Mother Maralyn SagoSarah cell [phone].

## 2016-08-15 ENCOUNTER — Encounter (HOSPITAL_COMMUNITY)
Admission: RE | Admit: 2016-08-15 | Discharge: 2016-08-15 | Disposition: A | Discharge status: Home / Self Care | Payer: Self-pay | Source: Ambulatory Visit | Attending: Obstetrics & Gynecology | Admitting: Obstetrics & Gynecology

## 2016-08-15 ENCOUNTER — Encounter (HOSPITAL_COMMUNITY): Payer: Self-pay

## 2016-08-15 DIAGNOSIS — Z01812 Encounter for preprocedural laboratory examination: Secondary | ICD-10-CM | POA: Insufficient documentation

## 2016-08-15 HISTORY — DX: Unspecified ovarian cyst, unspecified side: N83.209

## 2016-08-15 LAB — CBC
HCT: 38.2 % (ref 36.0–46.0)
Hemoglobin: 12.7 g/dL (ref 12.0–15.0)
MCH: 27.4 pg (ref 26.0–34.0)
MCHC: 33.2 g/dL (ref 30.0–36.0)
MCV: 82.3 fL (ref 78.0–100.0)
PLATELETS: 305 10*3/uL (ref 150–400)
RBC: 4.64 MIL/uL (ref 3.87–5.11)
RDW: 14.7 % (ref 11.5–15.5)
WBC: 8.9 10*3/uL (ref 4.0–10.5)

## 2016-08-28 ENCOUNTER — Encounter (HOSPITAL_COMMUNITY)
Admission: RE | Disposition: A | Discharge status: Home / Self Care | Payer: Self-pay | Source: Ambulatory Visit | Attending: Obstetrics & Gynecology

## 2016-08-28 ENCOUNTER — Ambulatory Visit (HOSPITAL_COMMUNITY)
Admission: RE | Admit: 2016-08-28 | Discharge: 2016-08-28 | Disposition: A | Discharge status: Home / Self Care | Payer: Self-pay | Source: Ambulatory Visit | Attending: Obstetrics & Gynecology | Admitting: Obstetrics & Gynecology

## 2016-08-28 ENCOUNTER — Encounter (HOSPITAL_COMMUNITY): Payer: Self-pay

## 2016-08-28 ENCOUNTER — Inpatient Hospital Stay (HOSPITAL_COMMUNITY): Payer: Self-pay | Admitting: Certified Registered"

## 2016-08-28 DIAGNOSIS — N83201 Unspecified ovarian cyst, right side: Secondary | ICD-10-CM | POA: Diagnosis present

## 2016-08-28 DIAGNOSIS — N801 Endometriosis of ovary: Secondary | ICD-10-CM | POA: Insufficient documentation

## 2016-08-28 HISTORY — PX: LAPAROSCOPIC OVARIAN CYSTECTOMY: SHX6248

## 2016-08-28 LAB — PREGNANCY, URINE: Preg Test, Ur: NEGATIVE

## 2016-08-28 SURGERY — EXCISION, CYST, OVARY, LAPAROSCOPIC
Anesthesia: General | Laterality: Right

## 2016-08-28 MED ORDER — BUPIVACAINE HCL (PF) 0.5 % IJ SOLN
INTRAMUSCULAR | Status: AC
  Filled 2016-08-28: qty 30

## 2016-08-28 MED ORDER — LACTATED RINGERS IV SOLN
INTRAVENOUS | Status: DC
  Administered 2016-08-28: 07:00:00 via INTRAVENOUS

## 2016-08-28 MED ORDER — OXYCODONE HCL 5 MG PO TABS: 5.0000 mg | ORAL_TABLET | Freq: Once | ORAL | Status: DC | PRN

## 2016-08-28 MED ORDER — HEMOSTATIC AGENTS (NO CHARGE) OPTIME
TOPICAL | Status: DC | PRN
  Administered 2016-08-28: 1 via TOPICAL

## 2016-08-28 MED ORDER — SCOPOLAMINE 1 MG/3DAYS TD PT72
1.0000 | MEDICATED_PATCH | Freq: Once | TRANSDERMAL | Status: DC
  Administered 2016-08-28: 1.5 mg via TRANSDERMAL

## 2016-08-28 MED ORDER — LIDOCAINE HCL (CARDIAC) 20 MG/ML IV SOLN
INTRAVENOUS | Status: AC
  Filled 2016-08-28: qty 5

## 2016-08-28 MED ORDER — KETOROLAC TROMETHAMINE 30 MG/ML IJ SOLN
INTRAMUSCULAR | Status: AC
  Filled 2016-08-28: qty 1

## 2016-08-28 MED ORDER — SODIUM CHLORIDE 0.9 % IJ SOLN
INTRAMUSCULAR | Status: AC
  Filled 2016-08-28: qty 10

## 2016-08-28 MED ORDER — IBUPROFEN 800 MG PO TABS: 800.0000 mg | ORAL_TABLET | Freq: Three times a day (TID) | ORAL | 0 refills | Status: AC | PRN

## 2016-08-28 MED ORDER — ROCURONIUM BROMIDE 100 MG/10ML IV SOLN
INTRAVENOUS | Status: AC
  Filled 2016-08-28: qty 1

## 2016-08-28 MED ORDER — ONDANSETRON HCL 4 MG/2ML IJ SOLN
INTRAMUSCULAR | Status: AC
  Filled 2016-08-28: qty 2

## 2016-08-28 MED ORDER — DEXAMETHASONE SODIUM PHOSPHATE 10 MG/ML IJ SOLN
INTRAMUSCULAR | Status: DC | PRN
  Administered 2016-08-28: 10 mg via INTRAVENOUS

## 2016-08-28 MED ORDER — LIDOCAINE 2% (20 MG/ML) 5 ML SYRINGE
INTRAMUSCULAR | Status: DC | PRN
  Administered 2016-08-28: 40 mg via INTRAVENOUS

## 2016-08-28 MED ORDER — SCOPOLAMINE 1 MG/3DAYS TD PT72
MEDICATED_PATCH | TRANSDERMAL | Status: AC
  Filled 2016-08-28: qty 1

## 2016-08-28 MED ORDER — FENTANYL CITRATE (PF) 100 MCG/2ML IJ SOLN
INTRAMUSCULAR | Status: DC | PRN
  Administered 2016-08-28: 50 ug via INTRAVENOUS
  Administered 2016-08-28: 100 ug via INTRAVENOUS
  Administered 2016-08-28 (×2): 50 ug via INTRAVENOUS

## 2016-08-28 MED ORDER — DEXAMETHASONE SODIUM PHOSPHATE 10 MG/ML IJ SOLN
INTRAMUSCULAR | Status: AC
  Filled 2016-08-28: qty 1

## 2016-08-28 MED ORDER — FENTANYL CITRATE (PF) 250 MCG/5ML IJ SOLN
INTRAMUSCULAR | Status: AC
  Filled 2016-08-28: qty 5

## 2016-08-28 MED ORDER — OXYCODONE HCL 5 MG/5ML PO SOLN: 5.0000 mg | Freq: Once | ORAL | Status: DC | PRN

## 2016-08-28 MED ORDER — KETOROLAC TROMETHAMINE 30 MG/ML IJ SOLN
INTRAMUSCULAR | Status: DC | PRN
  Administered 2016-08-28: 30 mg via INTRAVENOUS

## 2016-08-28 MED ORDER — ROCURONIUM BROMIDE 100 MG/10ML IV SOLN
INTRAVENOUS | Status: DC | PRN
  Administered 2016-08-28: 35 mg via INTRAVENOUS
  Administered 2016-08-28: 5 mg via INTRAVENOUS

## 2016-08-28 MED ORDER — LACTATED RINGERS IR SOLN
Status: DC | PRN
  Administered 2016-08-28: 2000 mL

## 2016-08-28 MED ORDER — HYDROMORPHONE HCL 1 MG/ML IJ SOLN: 0.2500 mg | INTRAMUSCULAR | Status: DC | PRN

## 2016-08-28 MED ORDER — PROMETHAZINE HCL 25 MG/ML IJ SOLN: 6.2500 mg | INTRAMUSCULAR | Status: DC | PRN

## 2016-08-28 MED ORDER — PROPOFOL 10 MG/ML IV BOLUS
INTRAVENOUS | Status: DC | PRN
  Administered 2016-08-28: 150 mg via INTRAVENOUS

## 2016-08-28 MED ORDER — SUGAMMADEX SODIUM 200 MG/2ML IV SOLN
INTRAVENOUS | Status: AC
  Filled 2016-08-28: qty 2

## 2016-08-28 MED ORDER — MIDAZOLAM HCL 5 MG/5ML IJ SOLN
INTRAMUSCULAR | Status: DC | PRN
  Administered 2016-08-28: 2 mg via INTRAVENOUS

## 2016-08-28 MED ORDER — SUGAMMADEX SODIUM 200 MG/2ML IV SOLN
INTRAVENOUS | Status: DC | PRN
  Administered 2016-08-28: 200 mg via INTRAVENOUS

## 2016-08-28 MED ORDER — BUPIVACAINE HCL (PF) 0.5 % IJ SOLN
INTRAMUSCULAR | Status: DC | PRN
  Administered 2016-08-28: 25 mL

## 2016-08-28 MED ORDER — PROPOFOL 10 MG/ML IV BOLUS
INTRAVENOUS | Status: AC
  Filled 2016-08-28: qty 20

## 2016-08-28 MED ORDER — MEPERIDINE HCL 25 MG/ML IJ SOLN: 6.2500 mg | INTRAMUSCULAR | Status: DC | PRN

## 2016-08-28 MED ORDER — MIDAZOLAM HCL 2 MG/2ML IJ SOLN
INTRAMUSCULAR | Status: AC
  Filled 2016-08-28: qty 2

## 2016-08-28 MED ORDER — ONDANSETRON HCL 4 MG/2ML IJ SOLN
INTRAMUSCULAR | Status: DC | PRN
  Administered 2016-08-28: 4 mg via INTRAVENOUS

## 2016-08-28 SURGICAL SUPPLY — 37 items
APPLICATOR ARISTA FLEXITIP XL (MISCELLANEOUS) ×3 IMPLANT
CABLE HIGH FREQUENCY MONO STRZ (ELECTRODE) IMPLANT
CATH ROBINSON RED A/P 16FR (CATHETERS) IMPLANT
CLOTH BEACON ORANGE TIMEOUT ST (SAFETY) ×3 IMPLANT
DERMABOND ADVANCED (GAUZE/BANDAGES/DRESSINGS) ×2
DERMABOND ADVANCED .7 DNX12 (GAUZE/BANDAGES/DRESSINGS) ×1 IMPLANT
DRSG OPSITE POSTOP 3X4 (GAUZE/BANDAGES/DRESSINGS) ×6 IMPLANT
DRSG TELFA 3X8 NADH (GAUZE/BANDAGES/DRESSINGS) ×3 IMPLANT
DURAPREP 26ML APPLICATOR (WOUND CARE) ×3 IMPLANT
GLOVE BIO SURGEON STRL SZ7 (GLOVE) ×3 IMPLANT
GLOVE BIOGEL PI IND STRL 7.0 (GLOVE) ×2 IMPLANT
GLOVE BIOGEL PI INDICATOR 7.0 (GLOVE) ×4
GOWN STRL REUS W/TWL LRG LVL3 (GOWN DISPOSABLE) ×6 IMPLANT
GOWN STRL REUS W/TWL XL LVL3 (GOWN DISPOSABLE) ×3 IMPLANT
HEMOSTAT ARISTA ABSORB 3G PWDR (MISCELLANEOUS) ×3 IMPLANT
MANIPULATOR UTERINE 4.5 ZUMI (MISCELLANEOUS) ×3 IMPLANT
NEEDLE INSUFFLATION 120MM (ENDOMECHANICALS) ×3 IMPLANT
NS IRRIG 1000ML POUR BTL (IV SOLUTION) ×3 IMPLANT
PACK LAPAROSCOPY BASIN (CUSTOM PROCEDURE TRAY) ×3 IMPLANT
PACK TRENDGUARD 450 HYBRID PRO (MISCELLANEOUS) ×1 IMPLANT
PACK TRENDGUARD 600 HYBRD PROC (MISCELLANEOUS) IMPLANT
POUCH SPECIMEN RETRIEVAL 10MM (ENDOMECHANICALS) IMPLANT
PROTECTOR NERVE ULNAR (MISCELLANEOUS) ×6 IMPLANT
SET IRRIG TUBING LAPAROSCOPIC (IRRIGATION / IRRIGATOR) ×3 IMPLANT
SHEARS HARMONIC ACE PLUS 36CM (ENDOMECHANICALS) ×3 IMPLANT
SLEEVE XCEL OPT CAN 5 100 (ENDOMECHANICALS) ×6 IMPLANT
SUT VIC AB 3-0 X1 27 (SUTURE) ×3 IMPLANT
SUT VICRYL 0 UR6 27IN ABS (SUTURE) ×6 IMPLANT
SUT VICRYL 4-0 PS2 18IN ABS (SUTURE) ×3 IMPLANT
SYRINGE 10CC LL (SYRINGE) ×3 IMPLANT
SYSTEM CARTER THOMASON II (TROCAR) ×3 IMPLANT
TOWEL OR 17X24 6PK STRL BLUE (TOWEL DISPOSABLE) ×6 IMPLANT
TRAY FOLEY CATH SILVER 14FR (SET/KITS/TRAYS/PACK) ×3 IMPLANT
TRENDGUARD 450 HYBRID PRO PACK (MISCELLANEOUS) ×3
TRENDGUARD 600 HYBRID PROC PK (MISCELLANEOUS)
TROCAR OPTI TIP 5M 100M (ENDOMECHANICALS) IMPLANT
TROCAR XCEL DIL TIP R 11M (ENDOMECHANICALS) ×3 IMPLANT

## 2016-08-28 NOTE — Anesthesia Procedure Notes (Signed)
Procedure Name: Intubation Date/Time: 08/28/2016 9:13 AM Performed by: Early OsmondEARGLE, Clemma Johnsen E Pre-anesthesia Checklist: Patient identified, Emergency Drugs available, Suction available and Patient being monitored Patient Re-evaluated:Patient Re-evaluated prior to induction Oxygen Delivery Method: Circle system utilized Preoxygenation: Pre-oxygenation with 100% oxygen Induction Type: IV induction Ventilation: Mask ventilation without difficulty Laryngoscope Size: Miller and 2 Grade View: Grade I Tube type: Oral Tube size: 7.0 mm Number of attempts: 1 Airway Equipment and Method: Stylet and Oral airway Placement Confirmation: ETT inserted through vocal cords under direct vision,  positive ETCO2 and breath sounds checked- equal and bilateral Secured at: 21 cm Tube secured with: Tape Dental Injury: Teeth and Oropharynx as per pre-operative assessment

## 2016-08-28 NOTE — Transfer of Care (Signed)
Immediate Anesthesia Transfer of Care Note  Patient: Janet Navarro  Procedure(s) Performed: Procedure(s): LAPAROSCOPIC OVARIAN CYSTECTOMY AND LYSIS OF ADHESIONS (Right)  Patient Location: PACU  Anesthesia Type:General  Level of Consciousness: awake, alert , oriented, sedated and patient cooperative  Airway & Oxygen Therapy: Patient Spontanous Breathing and Patient connected to nasal cannula oxygen  Post-op Assessment: Report given to RN and Post -op Vital signs reviewed and stable  Post vital signs: Reviewed and stable  Last Vitals:  Vitals:   08/28/16 0655  BP: 117/78  Pulse: (!) 56  Resp: 18  Temp: 36.5 C  SpO2: 99%    Last Pain:  Vitals:   08/28/16 0655  TempSrc: Oral      Patients Stated Pain Goal: 3 (08/28/16 0655)  Complications: No apparent anesthesia complications

## 2016-08-28 NOTE — Op Note (Signed)
08/28/2016  11:06 AM  PATIENT:  Janet Navarro  26 y.o. female  PRE-OPERATIVE DIAGNOSIS:  Ovarian Cyst  POST-OPERATIVE DIAGNOSIS:  ENDOMETRIOMA  AND ADEHSIONS   PROCEDURE:  Procedure(s): LAPAROSCOPIC OVARIAN CYSTECTOMY AND LYSIS OF ADHESIONS (Right)  SURGEON:  Surgeon(s) and Role:    * Willodean RosenthalHarraway-Smith, Amadi Yoshino, MD - Primary    * Dove, Leanora IvanoffMyra C, MD - Assisting  ANESTHESIA:   general  EBL:  Total I/O In: 900 [I.V.:900] Out: 10 [Blood:10]  BLOOD ADMINISTERED:none  DRAINS: none   LOCAL MEDICATIONS USED:  MARCAINE     SPECIMEN:  Source of Specimen:  ovarian cyst wall  DISPOSITION OF SPECIMEN:  PATHOLOGY  COUNTS:  YES  TOURNIQUET:  * No tourniquets in log *  DICTATION: .Note written in EPIC  PLAN OF CARE: Discharge to home after PACU  PATIENT DISPOSITION: PACU - hemodynamically stable  Delay start of Pharmacological VTE agent (>24hrs) due to surgical blood loss or risk of bleeding: not applicable  Complications: none immediate  INDICATIONS: 26 y.o. G0P0 here with the preoperative diagnoses as listed above.  Please refer to preoperative notes for more details. Patient was counseled regarding need for laparoscopic ovarian cystectomy. Risks of surgery including bleeding which may require transfusion or reoperation, infection, injury to bowel or other surrounding organs, need for additional procedures including laparotomy and other postoperative/anesthesia complications were explained to patient.  Written informed consent was obtained.  FINDINGS:  Endometrioma in right ovary. Both fallopian tubes were WNL. The left ovary appeared to be normal . There were endometriotic implants noted throughout the pelvis.  There were adhesions the large bowel to the left pelvic side wall and adhesion of the right adnexa to the ant abdominal wall.   PROCEDURE IN DETAIL:  The patient was taken to the operating room where general anesthesia was administered and was found to be adequate.  She was  placed in the dorsal lithotomy position, and was prepped and draped in a sterile manner.  Her bladder was drained with a red rubber catherter. A uterine manipulator was then advanced into the uterus .  After an adequate timeout was performed, attention was then turned to the patient's abdomen where a 5-mm skin incision was made on the umbilical fold.  A Verres needle was inserted and placement was confirmed by drop in intraabdominal pressure with insufflation of carbon dioxide gas.  Adequate pneumoperitoneum was obtained.  A 5 mm trocar was then placed. A survey of the patient's pelvis and abdomen revealed the findings as above. A 5-mm right lower quadrant ports was placed on the left side of the pelvis and a 10 mm trocar was placed under direct visualization on the right side.  The right ovarian cyst was was scored with the Harmonic scalpel and the overlying ovarian wall was separated from the cyst by peeling it off. The cyst ruptured revealing a large amount of hemosiderin.   The specimen was removed.  The pelvis was irrigated copiously with NS.   The ovary was treated with Arista.  Excellent hemostasis was noted. The abdomen was desufflated, and all instruments were removed.  The right lower quadrant port fascia was closed with 0 vicryl.  All skin incisions were closed with 3-0 vicryl and Dermabond.   The patient tolerated the procedure well.  All instruments, needles, and sponge counts were correct x 2. The patient was taken to the recovery room in stable condition.   The patient will be discharged to home as per PACU criteria.  She will follow  up in the clinic in about 2 weeks for postoperative evaluation.  Jahayra Mazo L. Harraway-Smith, M.D., [facility]

## 2016-08-28 NOTE — H&P (Signed)
Preoperative History and Physical  Janet Navarro is a 26 y.o. No obstetric history on file. here for surgical management of right ovarian dermoid.   Proposed surgery:  laparoscopic right ovarian cystectomy  Past Medical History:  Diagnosis Date  . Ovarian cyst    History reviewed. No pertinent surgical history. OB History    No data available     Patient denies any cervical dysplasia or STIs. No prescriptions prior to admission.    Allergies  Allergen Reactions  . Fish Allergy Hives    Swai fish   Social History:   reports that she has never smoked. She has never used smokeless tobacco. She reports that she drinks alcohol. She reports that she uses drugs, including Marijuana. Family History  Problem Relation Age of Onset  . Cancer Other   . Hypertension Other     Review of Systems: Noncontributory  PHYSICAL EXAM: Blood pressure 117/78, pulse (!) 56, temperature 97.7 F (36.5 C), temperature source Oral, resp. rate 18, last menstrual period 08/07/2016, SpO2 99 %. General appearance - alert, well appearing, and in no distress Chest - clear to auscultation, no wheezes, rales or rhonchi, symmetric air entry Heart - normal rate and regular rhythm Abdomen - soft, nontender, nondistended, no masses or organomegaly Pelvic - examination not indicated Extremities - peripheral pulses normal, no pedal edema, no clubbing or cyanosis  Labs: Results for orders placed or performed during the hospital encounter of 08/28/16 (from the past 336 hour(s))  Pregnancy, urine   Collection Time: 08/28/16  6:45 AM  Result Value Ref Range   Preg Test, Ur NEGATIVE NEGATIVE  Results for orders placed or performed during the hospital encounter of 08/15/16 (from the past 336 hour(s))  CBC   Collection Time: 08/15/16  8:40 AM  Result Value Ref Range   WBC 8.9 4.0 - 10.5 K/uL   RBC 4.64 3.87 - 5.11 MIL/uL   Hemoglobin 12.7 12.0 - 15.0 g/dL   HCT 16.138.2 09.636.0 - 04.546.0 %   MCV 82.3 78.0 - 100.0 fL   MCH 27.4 26.0 - 34.0 pg   MCHC 33.2 30.0 - 36.0 g/dL   RDW 40.914.7 81.111.5 - 91.415.5 %   Platelets 305 150 - 400 K/uL    Imaging Studies: 06/06/2016 CLINICAL DATA:  Follow-up ovarian cysts  EXAM: TRANSABDOMINAL ULTRASOUND OF PELVIS  TECHNIQUE: Transabdominal ultrasound examination of the pelvis was performed including evaluation of the uterus, ovaries, adnexal regions, and pelvic cul-de-sac.  COMPARISON:  02/27/2016  FINDINGS: Uterus  Measurements: 6.6 x 3.5 x 4.4 cm. No fibroids or other mass visualized.  Endometrium  Thickness: 9 mm in thickness.  No focal abnormality visualized.  Right ovary  Measurements: 5.3 x 5.0 x 5.2 cm complex hypoechoic area within the right ovary a again noted measuring 4.6 x 4.1 x 3.8 cm compared to 5.0 x 4.6 x 4.5 cm previously. Normal appearance/no adnexal mass.  Left ovary  Measurements: 3.7 x 2.3 x 2.7 cm. Normal appearance/no adnexal mass.  Other findings:  No abnormal free fluid.  IMPRESSION: Hypoechoic mass within the right ovary is again noted, slightly smaller in size, now measuring 4.6 x 4.1 x 3.8 cm. This most likely reflects hemorrhagic cyst or endometrioma. Continued follow-up is recommended. Assessment: Right ovarian dermoid  Plan: Patient will undergo surgical management with laparoscopic right ovarian cystectomy.   The risks of surgery were discussed in detail with the patient including but not limited to: bleeding which may require transfusion or reoperation; infection which may require antibiotics; injury  to surrounding organs which may involve bowel, bladder, ureters ; need for additional procedures including laparoscopy or laparotomy; thromboembolic phenomenon, surgical site problems and other postoperative/anesthesia complications. Likelihood of success in alleviating the patient's condition was discussed. Routine postoperative instructions will be reviewed with the patient and her family in detail after surgery.   The patient concurred with the proposed plan, giving informed written consent for the surgery.  Patient has been NPO since last night she will remain NPO for procedure.  Anesthesia and OR aware.  Preoperative prophylactic antibiotics and SCDs ordered on call to the OR.  To OR when ready.  Chanetta Moosman L. Harraway-Smith, M.D., [facility] 08/28/2016 8:01 AM

## 2016-08-28 NOTE — Anesthesia Preprocedure Evaluation (Signed)
Anesthesia Evaluation  Patient identified by MRN, date of birth, ID band Patient awake    Reviewed: Allergy & Precautions, NPO status , Patient's Chart, lab work & pertinent test results  Airway Mallampati: II  TM Distance: >3 FB Neck ROM: Full    Dental no notable dental hx.    Pulmonary neg pulmonary ROS,    Pulmonary exam normal breath sounds clear to auscultation       Cardiovascular negative cardio ROS Normal cardiovascular exam Rhythm:Regular Rate:Normal     Neuro/Psych negative neurological ROS  negative psych ROS   GI/Hepatic negative GI ROS, Neg liver ROS,   Endo/Other  negative endocrine ROS  Renal/GU negative Renal ROS  negative genitourinary   Musculoskeletal negative musculoskeletal ROS (+)   Abdominal   Peds negative pediatric ROS (+)  Hematology negative hematology ROS (+)   Anesthesia Other Findings Ovarian cyst  Reproductive/Obstetrics negative OB ROS                             Anesthesia Physical Anesthesia Plan  ASA: II  Anesthesia Plan: General   Post-op Pain Management:    Induction: Intravenous  PONV Risk Score and Plan: 3 and Ondansetron, Dexamethasone, Midazolam and Scopolamine patch - Pre-op  Airway Management Planned: Oral ETT  Additional Equipment:   Intra-op Plan:   Post-operative Plan: Extubation in OR  Informed Consent: I have reviewed the patients History and Physical, chart, labs and discussed the procedure including the risks, benefits and alternatives for the proposed anesthesia with the patient or authorized representative who has indicated his/her understanding and acceptance.   Dental advisory given  Plan Discussed with: CRNA  Anesthesia Plan Comments:         Anesthesia Quick Evaluation

## 2016-08-28 NOTE — Discharge Instructions (Signed)
Ovarian Cystectomy Ovarian cystectomy is surgery to remove a fluid-filled sac (cyst) on an ovary. The ovaries are small organs that produce eggs in women. Various types of cysts can form on the ovaries. Most are not cancerous. Surgery may be done if a cyst is large or is causing symptoms such as pain. It may also be done for a cyst that is or might be cancerous. This surgery can be done using a laparoscopic technique or an open abdominal technique. The laparoscopic technique involves smaller cuts (incisions) and a faster recovery time. The technique used will depend on your age, the type of cyst, and whether the cyst is cancerous. The laparoscopic technique is not used for a cancerous cyst. LET [facility] CARE PROVIDER KNOW ABOUT:  Any allergies you have.  All medicines you are taking, including vitamins, herbs, eye drops, creams, and over-the-counter medicines.  Previous problems you or members of your family have had with the use of anesthetics.  Any blood disorders you have.  Previous surgeries you have had.  Medical conditions you have.  Any chance you might be pregnant. RISKS AND COMPLICATIONS Generally, this is a safe procedure. However, as with any procedure, complications can occur. Possible complications include:  Excessive bleeding.  Infection.  Injury to other organs.  Blood clots.  Becoming incapable of getting pregnant (infertile).  BEFORE THE PROCEDURE  Ask your health care provider about changing or stopping any regular medicines. Avoid taking aspirin, ibuprofen, or blood thinners as directed by your health care provider.  Do not eat or drink anything after midnight the night before surgery.  If you smoke, do not smoke for at least 2 weeks before your surgery.  Do not drink alcohol the day before your surgery.  Let your health care provider know if you develop a cold or any infection before your surgery.  Arrange for someone to drive you home after the  procedure or after your hospital stay. Also arrange for someone to help you with activities during recovery. PROCEDURE Either a laparoscopic technique or an open abdominal technique may be used for this surgery.  Small monitors will be put on your body. They are used to check your heart, blood pressure, and oxygen level.  An IV access tube will be put into one of your veins. Medicine will be able to flow directly into your body through this IV tube.  You might be given a medicine to help you relax (sedative).  You will be given a medicine to make you sleep (general anesthetic). A breathing tube may be placed into your lungs during the procedure.  Laparoscopic Technique  Several small cuts (incisions) are made in your abdomen. These are typically about 1 to 2 cm long.  Your abdomen will be filled with carbon dioxide gas so that it expands. This gives the surgeon more room to operate and makes your organs easier to see.  A thin, lighted tube with a tiny camera on the end (laparoscope) is put through one of the small incisions. The camera on the laparoscope sends a picture to a TV screen in the operating room. This gives the surgeon a good view inside your abdomen.  Hollow tubes are put through the other small incisions in your abdomen. The tools needed for the procedure are put through these tubes.  The ovary with the cyst is identified, and the cyst is removed. It is sent to the lab for testing. If it is cancer, both ovaries may need to be removed  during a different surgery.  Tools are removed. The incisions are then closed with stitches or skin glue, and dressings may be applied. Open Abdominal Technique  A single large incision is made along your bikini line or in the middle of your lower abdomen.  The ovary with the cyst is identified, and the cyst is removed. It is sent to the lab for testing. If it is cancer, both ovaries may need to be removed during a different surgery.  The  incision is then closed with stitches or staples. What to expect after the procedure  You will wake up from anesthesia and be taken to a recovery area.  If you had laparoscopic surgery, you may be able to go home the same day, or you may need to stay in the hospital overnight.  If you had open abdominal surgery, you will need to stay in the hospital for a few days.  Your IV access tube and catheter will be removed the first or second day, after you are able to eat and drink enough.  You may be given medicine to relieve pain or to help you sleep.  You may be given an antibiotic medicine if needed. This information is not intended to replace advice given to you by your health care provider. Make sure you discuss any questions you have with your health care provider. Document Released: 10/29/2006 Document Revised: 06/09/2015 Document Reviewed: 08/13/2012 Elsevier Interactive Patient Education  2017 [employer] DISCHARGE INSTRUCTIONS: Laparoscopy  The following instructions have been prepared to help you care for yourself upon your return home today.  Wound care:  Do not get the incision wet for the first 24 hours. The incision should be kept clean and dry.  The Band-Aids or dressings may be removed the day after surgery.  Should the incision become sore, red, and swollen after the first week, check with your doctor.  Personal hygiene:  Shower the day after your procedure.  Activity and limitations:  Do NOT drive or operate any equipment today.  Do NOT lift anything more than 15 pounds for 2-3 weeks after surgery.  Do NOT rest in bed all day.  Walking is encouraged. Walk each day, starting slowly with 5-minute walks 3 or 4 times a day. Slowly increase the length of your walks.  Walk up and down stairs slowly.  Do NOT do strenuous activities, such as golfing, playing tennis, bowling, running, biking, weight lifting, gardening, mowing, or vacuuming for 2-4 weeks. Ask your  doctor when it is okay to start.  Diet: Eat a light meal as desired this evening. You may resume your usual diet tomorrow.  Return to work: This is dependent on the type of work you do. For the most part you can return to a desk job within a week of surgery. If you are more active at work, please discuss this with your doctor.  What to expect after your surgery: You may have a slight burning sensation when you urinate on the first day. You may have a very small amount of blood in the urine. Expect to have a small amount of vaginal discharge/light bleeding for 1-2 weeks. It is not unusual to have abdominal soreness and bruising for up to 2 weeks. You may be tired and need more rest for about 1 week. You may experience shoulder pain for 24-72 hours. Lying flat in bed may relieve it.  Call your doctor for any of the following:  Develop a fever of 100.4 or greater  Inability to urinate 6 hours after discharge from hospital  Severe pain not relieved by pain medications  Persistent of heavy bleeding at incision site  Redness or swelling around incision site after a week  Increasing nausea or vomiting   Can take ibuprofen/ motrin/advil  At 4PM today  08/28/16 Increase water next 48 hours. Can use heating pad to abd  Patient Signature________________________________________ Nurse Signature_________________________________________

## 2016-08-28 NOTE — Brief Op Note (Signed)
08/28/2016  11:06 AM  PATIENT:  Janet Navarro  26 y.o. female  PRE-OPERATIVE DIAGNOSIS:  Ovarian Cyst  POST-OPERATIVE DIAGNOSIS:  ENDOMETOMA  AND ADEHSIONS   PROCEDURE:  Procedure(s): LAPAROSCOPIC OVARIAN CYSTECTOMY AND LYSIS OF ADHESIONS (Right)  SURGEON:  Surgeon(s) and Role:    * Willodean RosenthalHarraway-Smith, Jamelah Sitzer, MD - Primary    * Dove, Leanora IvanoffMyra C, MD - Assisting  ANESTHESIA:   general  EBL:  Total I/O In: 900 [I.V.:900] Out: 10 [Blood:10]  BLOOD ADMINISTERED:none  DRAINS: none   LOCAL MEDICATIONS USED:  MARCAINE     SPECIMEN:  Source of Specimen:  ovarian cyst wall  DISPOSITION OF SPECIMEN:  PATHOLOGY  COUNTS:  YES  TOURNIQUET:  * No tourniquets in log *  DICTATION: .Note written in EPIC  PLAN OF CARE: Discharge to home after PACU  PATIENT DISPOSITION: PACU - hemodynamically stable  Delay start of Pharmacological VTE agent (>24hrs) due to surgical blood loss or risk of bleeding: not applicable  Complications: none immediate  Haddy Mullinax L. Harraway-Smith, M.D., [facility]

## 2016-08-29 ENCOUNTER — Encounter (HOSPITAL_COMMUNITY): Payer: Self-pay | Admitting: Obstetrics & Gynecology

## 2016-08-29 NOTE — Anesthesia Postprocedure Evaluation (Signed)
Anesthesia Post Note  Patient: Janet Navarro  Procedure(s) Performed: Procedure(s) (LRB): LAPAROSCOPIC OVARIAN CYSTECTOMY AND LYSIS OF ADHESIONS (Right)     Patient location during evaluation: PACU Anesthesia Type: General Level of consciousness: awake and alert Pain management: pain level controlled Vital Signs Assessment: post-procedure vital signs reviewed and stable Respiratory status: spontaneous breathing, nonlabored ventilation and respiratory function stable Cardiovascular status: blood pressure returned to baseline and stable Postop Assessment: no signs of nausea or vomiting Anesthetic complications: no    Last Vitals:  Vitals:   08/28/16 1107 08/28/16 1155  BP:  120/69  Pulse: 69 72  Resp: 14 16  Temp:  36.7 C  SpO2: 99% 100%    Last Pain:  Vitals:   08/28/16 1155  TempSrc: Oral  PainSc:                  Lowella CurbWarren Ray Demetres Prochnow

## 2016-08-30 ENCOUNTER — Telehealth: Payer: Self-pay | Admitting: General Practice

## 2016-08-30 NOTE — Telephone Encounter (Signed)
Called and left message on VM in regards to post op appointment with Dr. Marice Potterove on 09/20/16 at 12:40pm.  Asked patient to give our office a call if she is unable to keep this appointment.

## 2016-09-20 ENCOUNTER — Ambulatory Visit (INDEPENDENT_AMBULATORY_CARE_PROVIDER_SITE_OTHER): Payer: Self-pay | Admitting: Obstetrics & Gynecology

## 2016-09-20 ENCOUNTER — Encounter: Payer: Self-pay | Admitting: Obstetrics & Gynecology

## 2016-09-20 VITALS — BP 116/65 | HR 58 | Ht 67.0 in | Wt 179.6 lb

## 2016-09-20 DIAGNOSIS — Z9889 Other specified postprocedural states: Secondary | ICD-10-CM

## 2016-09-20 MED ORDER — NORGESTREL-ETHINYL ESTRADIOL 0.3-30 MG-MCG PO TABS: 1.0000 | ORAL_TABLET | Freq: Every day | ORAL | 6 refills | Status: DC

## 2016-09-20 NOTE — Progress Notes (Signed)
   Subjective:    Patient ID: Janet Navarro, female    DOB: 1990/08/07, 26 y.o.   MRN: [id number]  HPI 26 yo S AA G0 here for a post op visit. She had a laparoscopic removal of a right endometrioma. She denies any problems, returned to work after about 8-9 days. No sex since surgery.    Review of Systems Abstinent for about 3 years [place].    Objective:   Physical Exam  Well nourished, well hydrated black female, no apparent distress Breathing, conversing, and ambulating normally Incisions all healed well Abd- benign    Assessment & Plan:  S/p endometrioma removal We discussed options to keep endometriosis suppressed.  She opts for OCPs Start today RTC 6 weeks for BP check

## 2016-09-20 NOTE — Progress Notes (Signed)
Patient here today as a GYN visit, and post op of having a cyst removed.

## 2016-09-21 ENCOUNTER — Telehealth: Payer: Self-pay | Admitting: *Deleted

## 2016-09-21 NOTE — Telephone Encounter (Signed)
Janet Navarro called today and left a message she was supposed to have a rx sent in yesterday for birth control pills but when she went to pharmacy, they did not have it.  States needs to see what pharmacy it was sent to.

## 2016-09-24 NOTE — Telephone Encounter (Signed)
Called [facility] and left a message we are returning your call, if you still need us to clear up a medication issue, please call us back.

## 2016-09-25 ENCOUNTER — Telehealth: Payer: Self-pay

## 2016-09-25 NOTE — Telephone Encounter (Signed)
Patient called stating that she wants her prescription to a different pharmacy because the pharmacy that she is too expensive. Please return call.

## 2016-09-28 NOTE — Telephone Encounter (Signed)
Spoke with patient medication has been straighten out.

## 2016-10-01 NOTE — Telephone Encounter (Signed)
Issue resolved, see telephone call dated 09/25/16

## 2016-11-24 ENCOUNTER — Other Ambulatory Visit: Payer: Self-pay | Admitting: Obstetrics & Gynecology

## 2018-03-22 IMAGING — US US ART/VEN ABD/PELV/SCROTUM DOPPLER LTD
1 series · 13 of 25 positions shown · non-contrast
Comparison: None.

CLINICAL DATA: Acute onset right pelvic pain for 1 day. Clinical
suspicion for ovarian torsion.

EXAM:
TRANSABDOMINAL ULTRASOUND OF PELVIS
DOPPLER ULTRASOUND OF OVARIES
TECHNIQUE: Transabdominal ultrasound examination of the pelvis was performed
including evaluation of the uterus, ovaries, adnexal regions, and
pelvic cul-de-sac.
Color and duplex Doppler ultrasound was utilized to evaluate blood
flow to the ovaries. Patient refused transvaginal sonography.

[Series 1: us art/ven abd/pelv/scrotum doppler ltd · 0.22mm/px · 13 of 135 slices shown]
[im 1/135]
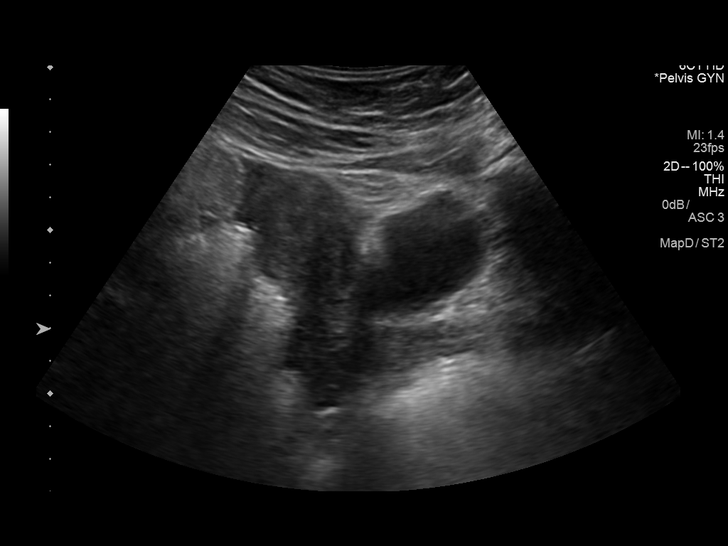
[im 12/135]
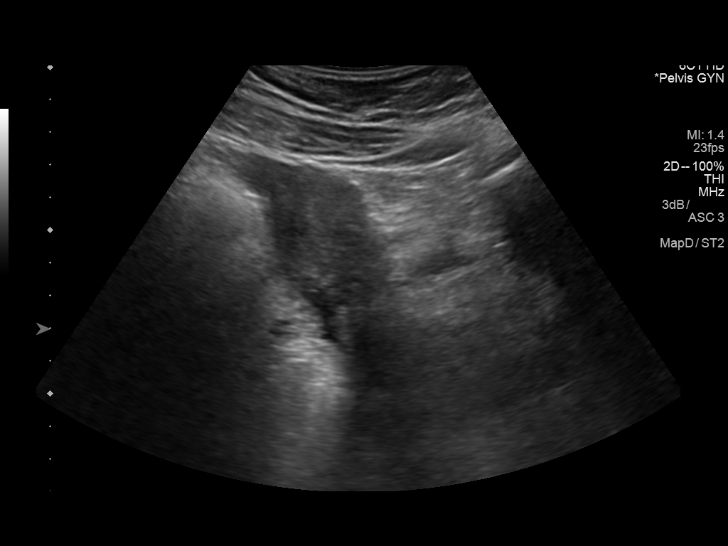
[im 23/135]
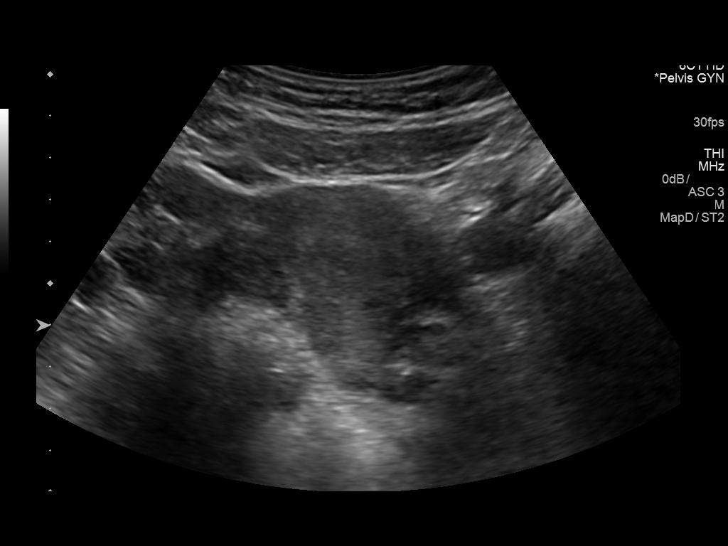
[im 34/135]
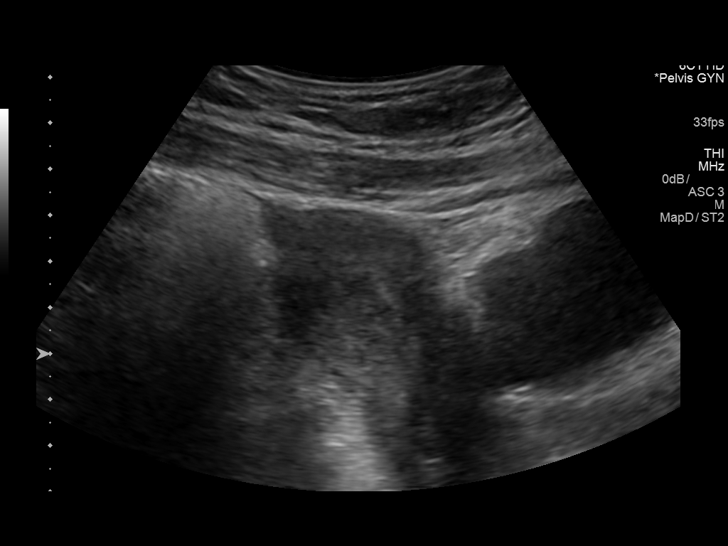
[im 45/135]
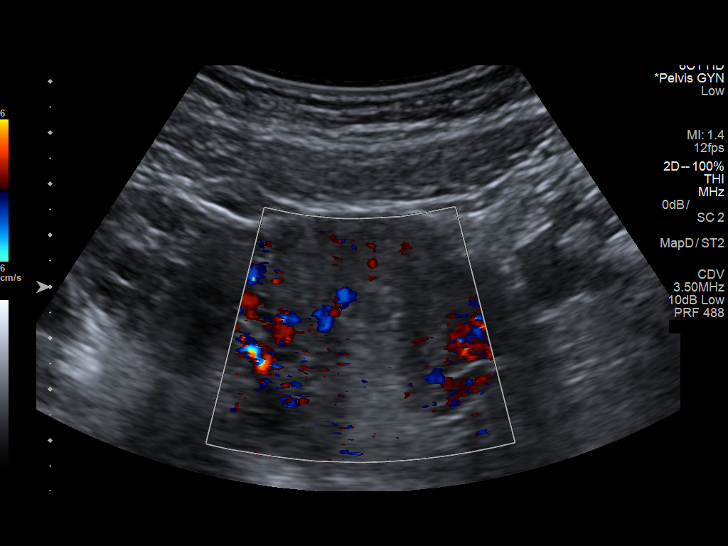
[im 56/135]
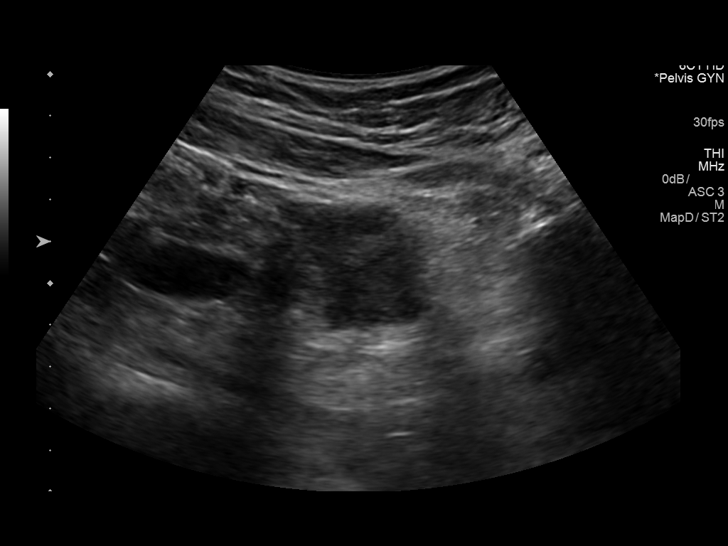
[im 68/135]
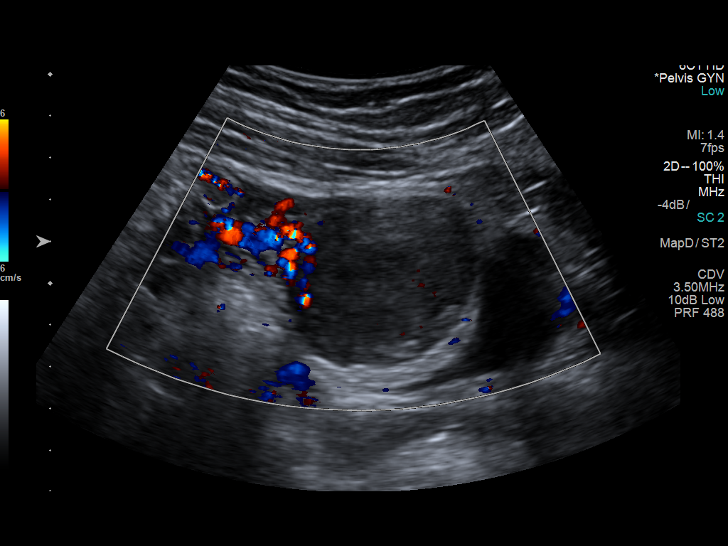
[im 79/135]
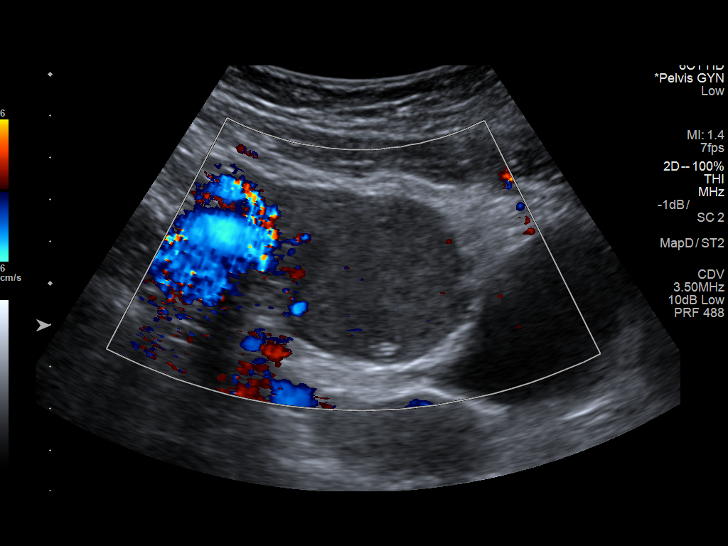
[im 90/135]
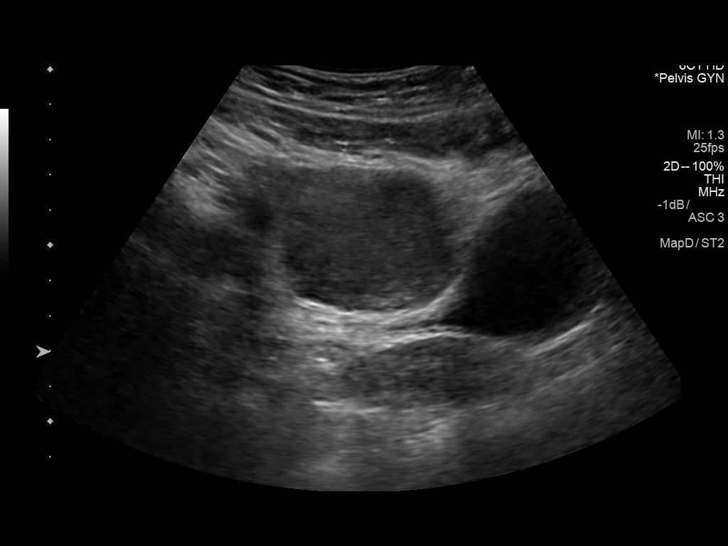
[im 101/135]
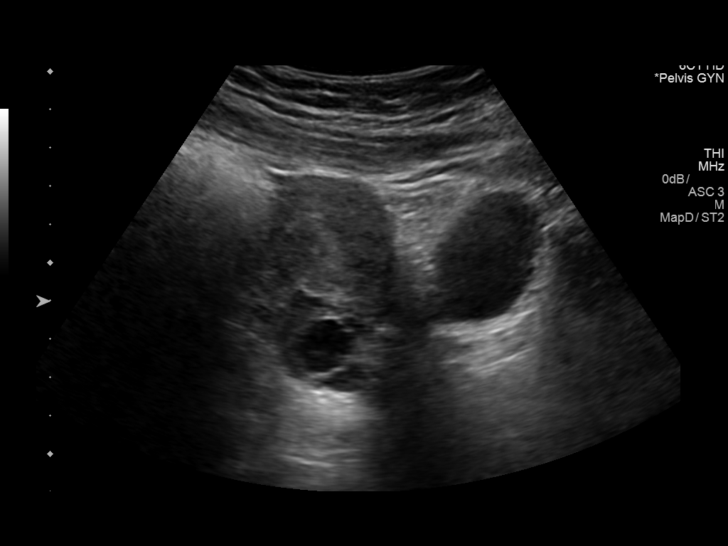
[im 112/135]
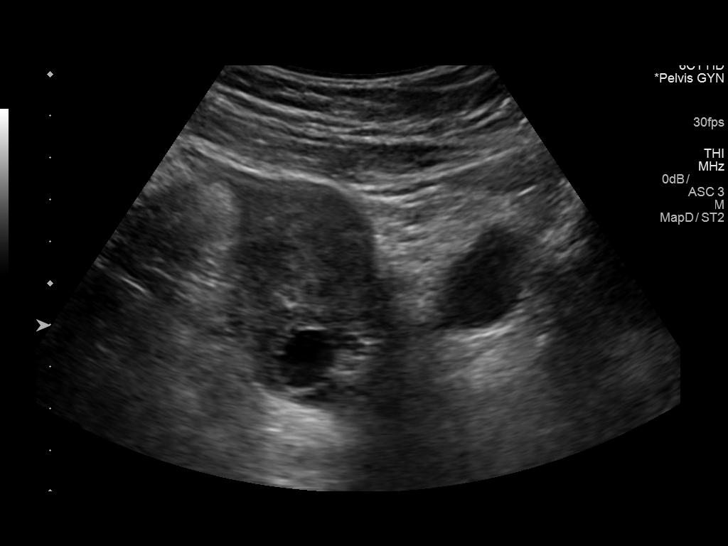
[im 123/135]
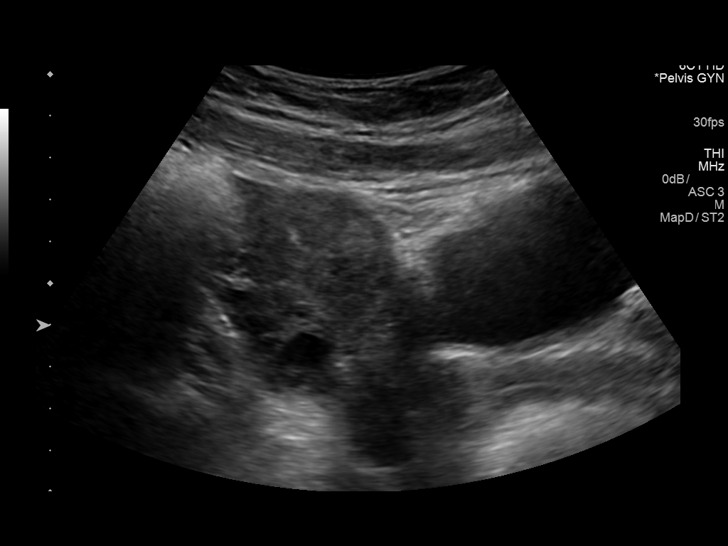
[im 135/135]
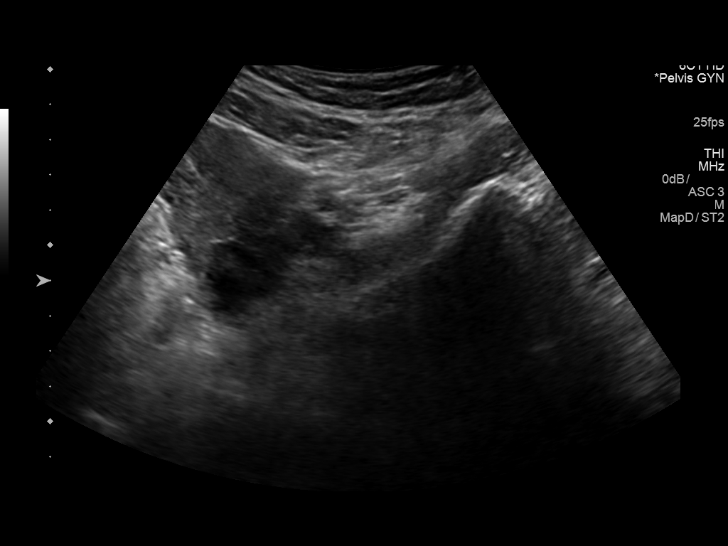

[13 of 25 positions shown; findings below may reference images not displayed]

FINDINGS: Uterus

Measurements: 7.4 x 3.4 x 4.4 cm. No fibroids or other mass
visualized.

Endometrium

Thickness: 10 mm transabdominally. No focal abnormality visualized
although evaluation is limited on this transabdominal only exam.

Right ovary

Measurements: 5.8 x 4.8 x 5.7 cm. A hypoechoic lesion with diffuse
low level internal echoes and increased through transmission is seen
which measures 5.0 x 3.9 x 4.6 cm. This lesion shows no internal
blood flow, although some blood flow is seen within adjacent normal
ovarian tissue on color Doppler ultrasound.

Left ovary

Measurements: 3.4 x 2.2 x 2.7 cm. Normal appearance/no adnexal mass.

Pulsed Doppler evaluation demonstrates normal low-resistance
arterial and venous waveforms in both ovaries.
IMPRESSION: 5 cm complex cystic lesion in right ovary, which has indeterminate
but probably benign characteristics. Differential diagnosis includes
hemorrhagic ovarian cyst, endometrioma, and cystic ovarian neoplasm.
Recommend followup by pelvic ultrasound in 6-12 weeks. This
recommendation follows the consensus statement: Management of
Asymptomatic Ovarian and Other Adnexal Cysts Imaged at US: Society
of Radiologists in Ultrasound Consensus Conference Statement.

No sonographic evidence for ovarian torsion.

Normal appearance of uterus and left ovary.

## 2018-09-27 IMAGING — US US PELVIS COMPLETE
1 series · 15 of 25 positions shown · non-contrast
Comparison: 01/12/2016.

CLINICAL DATA: Right ovarian cyst.

EXAM:
TRANSABDOMINAL ULTRASOUND OF PELVIS
TECHNIQUE: Transabdominal ultrasound examination of the pelvis was performed
including evaluation of the uterus, ovaries, adnexal regions, and
pelvic cul-de-sac. Patient refused transvaginal exam.

[Series 1: us pelvis complete · 35 acquisitions, 15 frames shown]
[im 1/35]
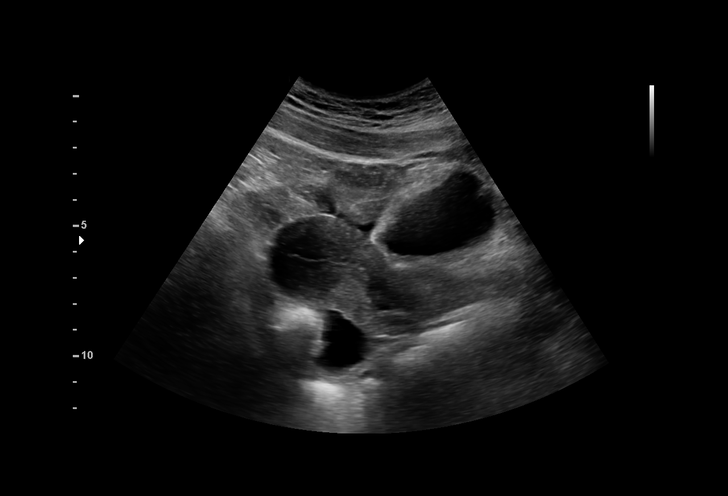
[im 3/35]
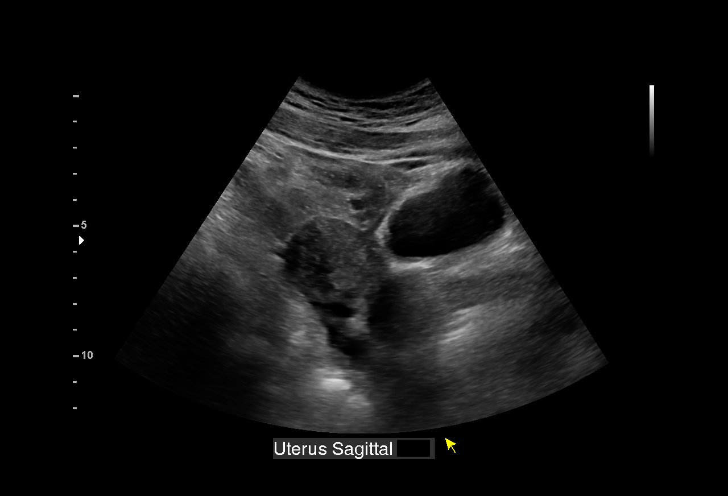
[im 6/35]
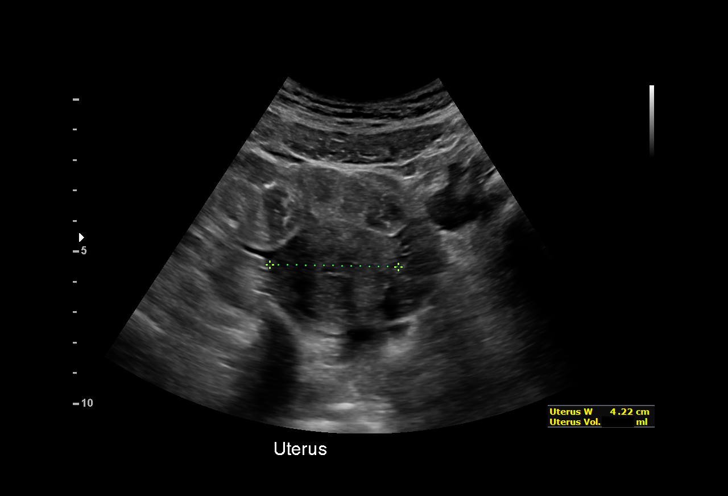
[im 8/35]
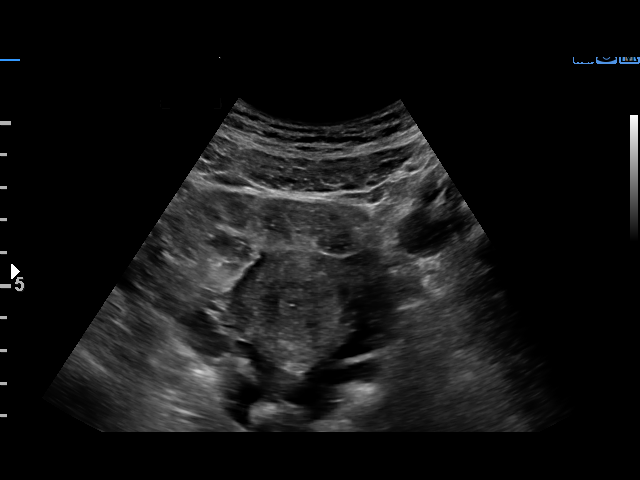
[im 10/35]
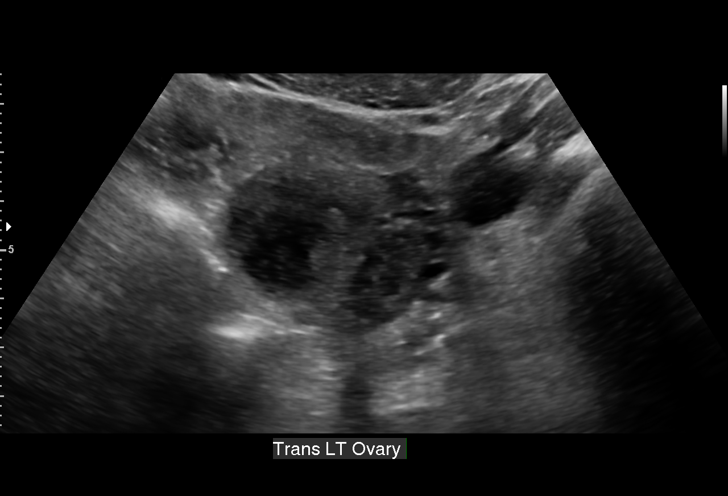
[im 13/35]
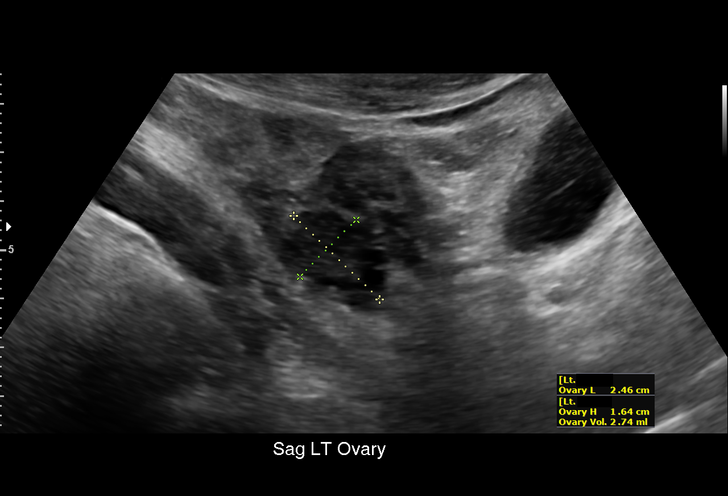
[im 15/35]
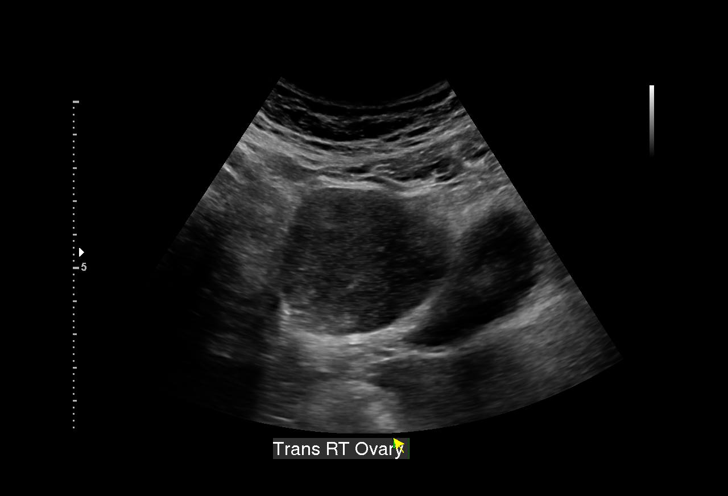
[im 18/35]
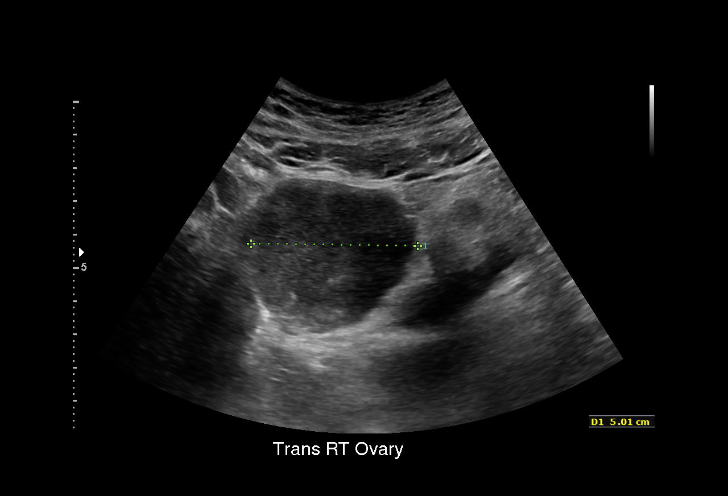
[im 20/35]
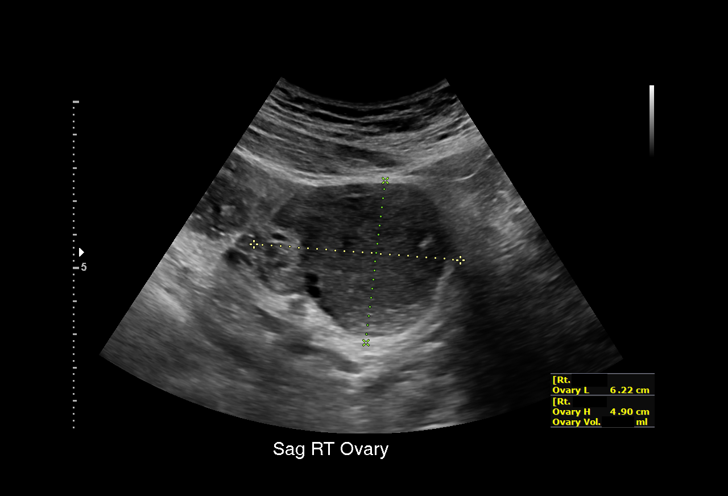
[im 22/35]
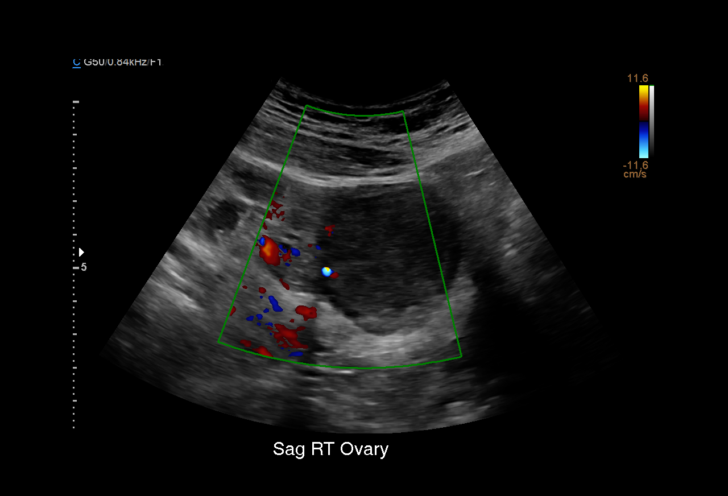
[im 25/35]
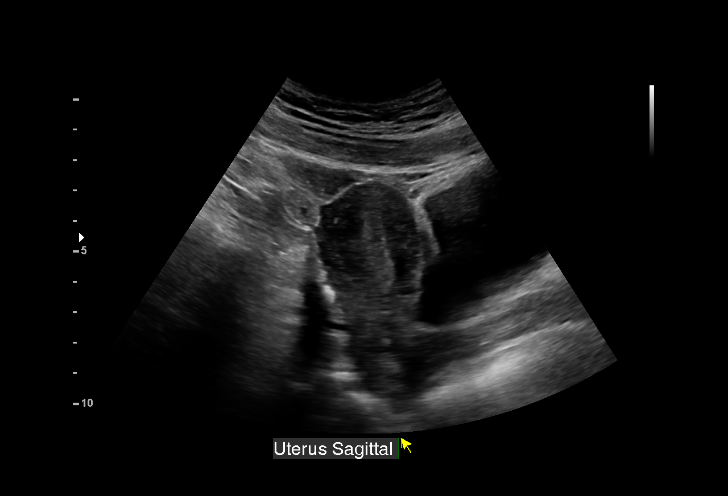
[im 27/35]
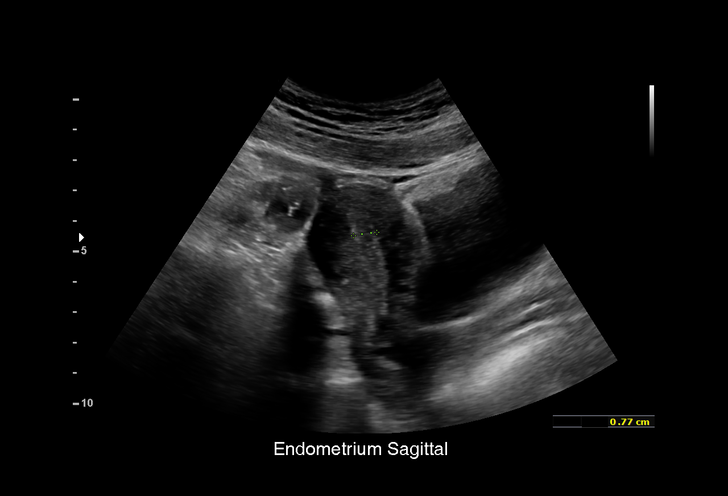
[im 29/35]
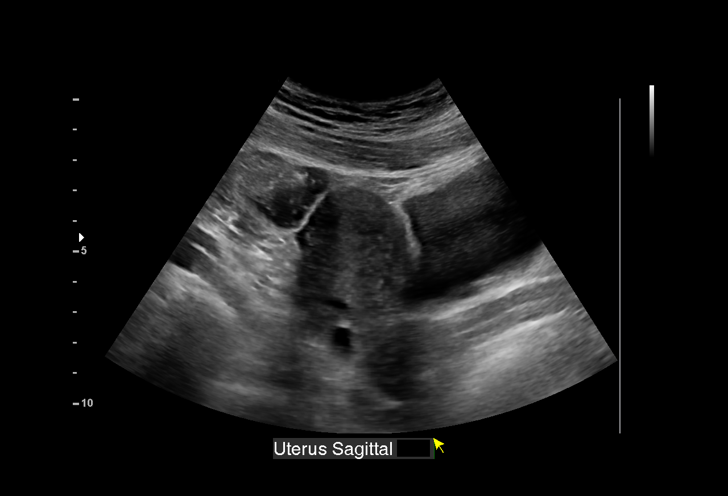
[im 32/35]
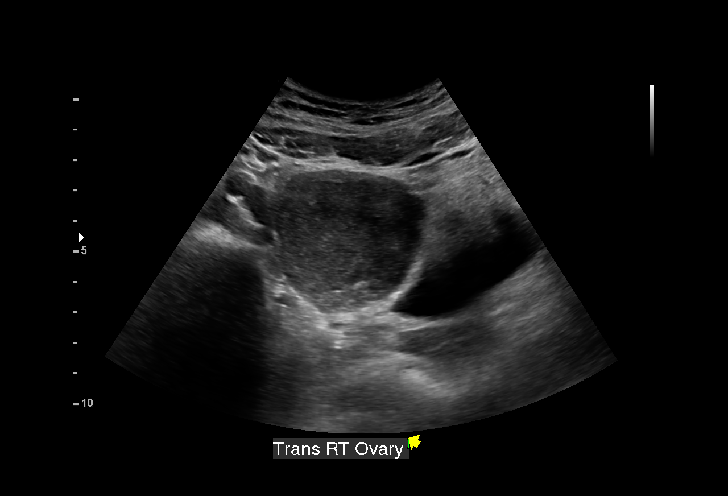
[im 35/35]
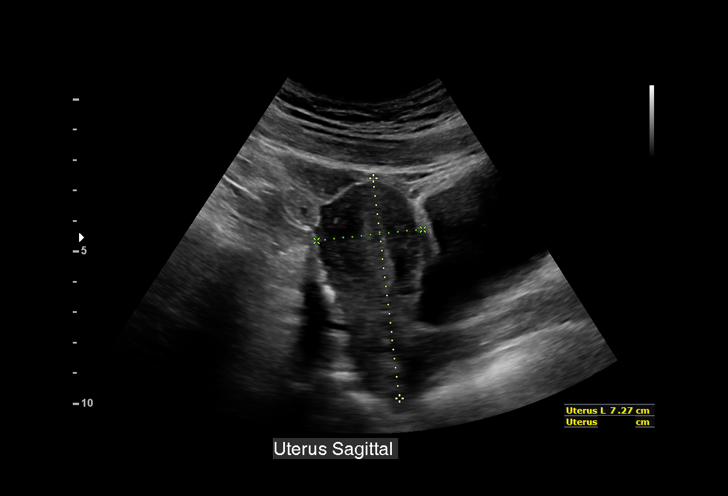

[15 of 25 positions shown; findings below may reference images not displayed]

FINDINGS: Uterus

Measurements: 7.3 x 3.5 x 4.2 cm. No fibroids or other mass
visualized.

Endometrium

Thickness: 8 mm.  No focal abnormality visualized.

Right ovary

Measurements: 6.2 x 4.9 x 5.5 cm. 5.0 x 4.5 x 4.6 cm complex mass
right ovary again noted. Slight interim increase in size.
Gynecologic evaluation suggested. Again differential diagnosis
includes hemorrhagic ovarian cyst, endometrioma, cystic/solid
ovarian neoplasm. Pregnancy test suggested to exclude ectopic
pregnancy.

Left ovary

Measurements: 2.5 x 1.6 x 1.3 cm. Normal appearance/no adnexal mass.

Other findings:  Trace free pelvic fluid.
IMPRESSION: Persistent 5 x 4.5 x 4.6 cm complex mass right ovary. Slight interim
increase in size. Again differential diagnosis includes hemorrhagic
ovarian cyst, endometrioma, cystic/solid ovarian neoplasm. Pregnancy
test to exclude ectopic pregnancy suggested. Gynecologic evaluation
should be considered.

## 2019-01-05 IMAGING — US US PELVIS COMPLETE
1 series · 15 of 25 positions shown · non-contrast
Comparison: 02/27/2016

CLINICAL DATA: Follow-up ovarian cysts

EXAM:
TRANSABDOMINAL ULTRASOUND OF PELVIS
TECHNIQUE: Transabdominal ultrasound examination of the pelvis was performed
including evaluation of the uterus, ovaries, adnexal regions, and
pelvic cul-de-sac.

[Series 1: us pelvis complete · 47 acquisitions, 15 frames shown]
[im 1/47]
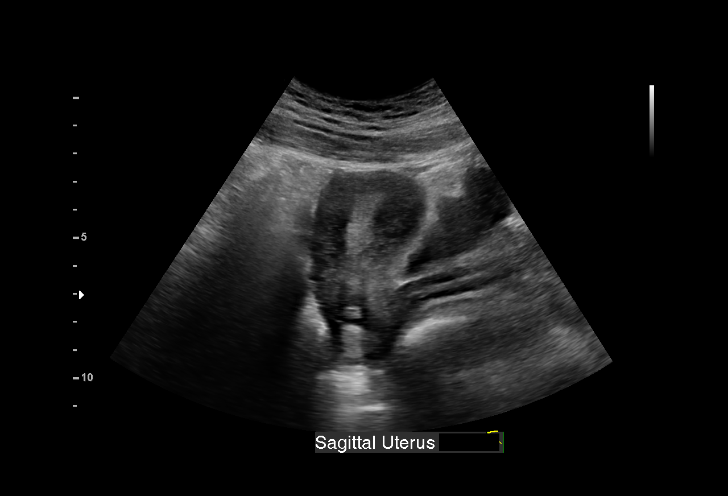
[im 4/47]
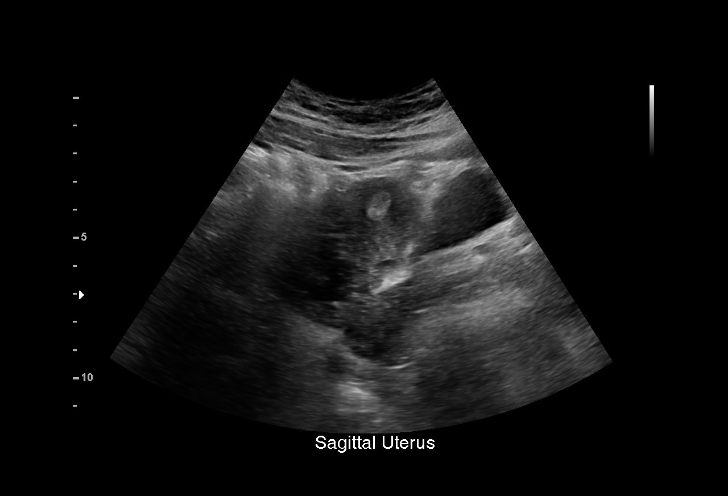
[im 8/47]
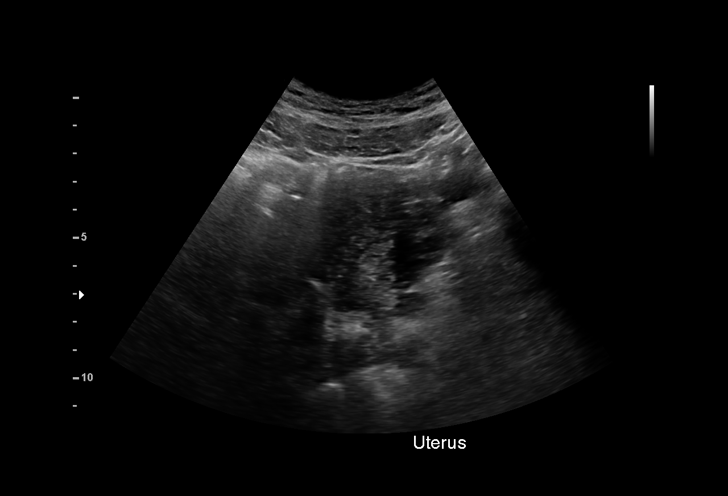
[im 10/47]
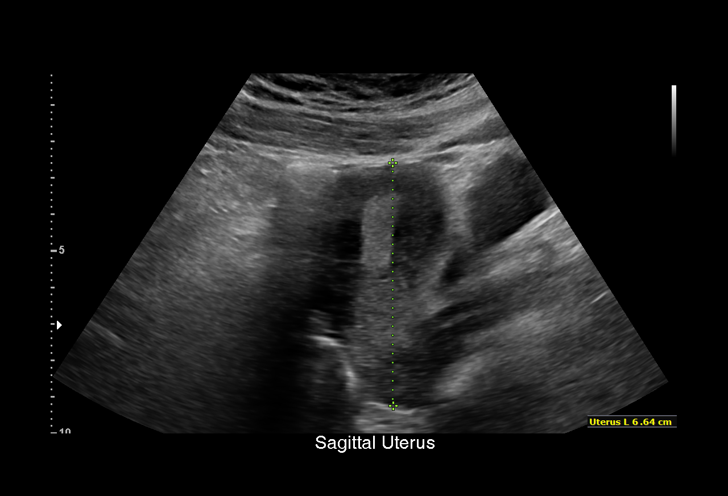
[im 14/47]
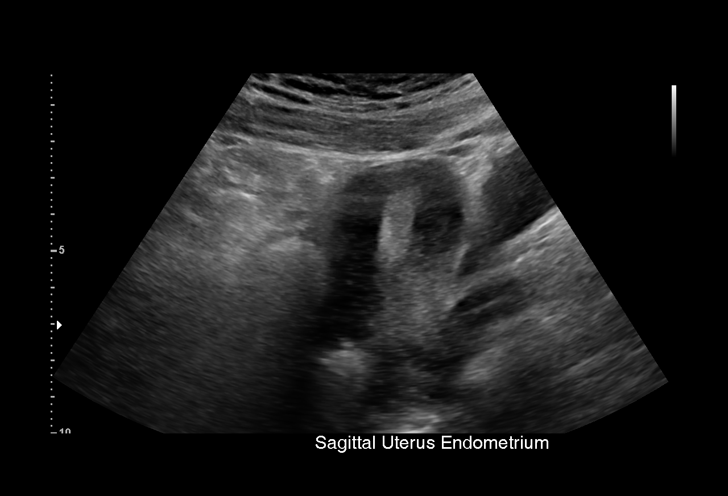
[im 18/47]
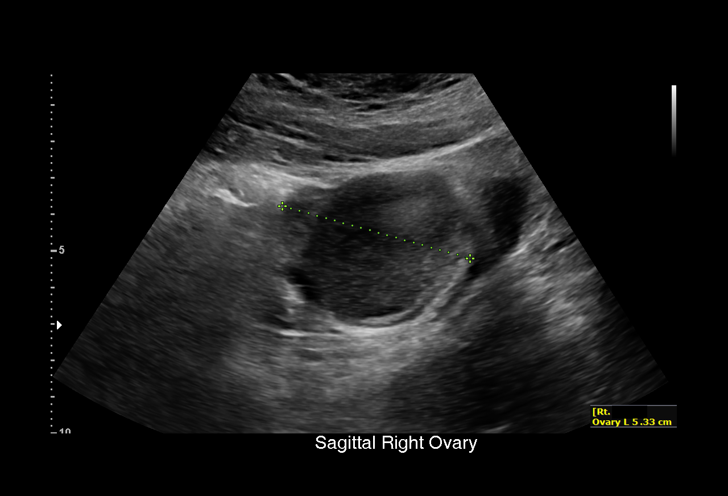
[im 20/47]
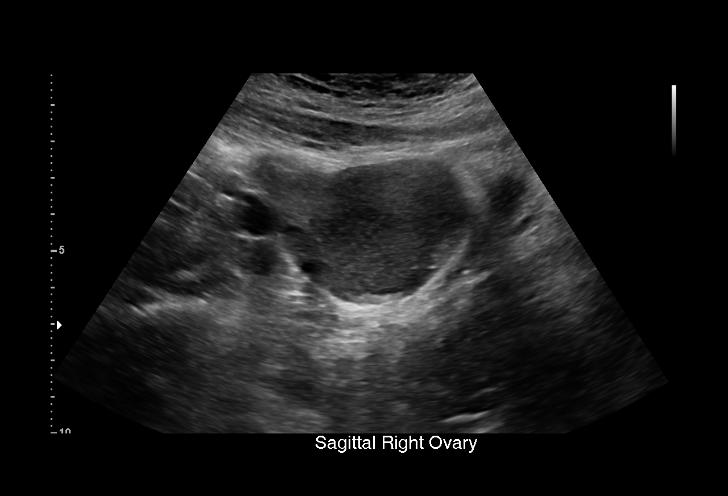
[im 24/47]
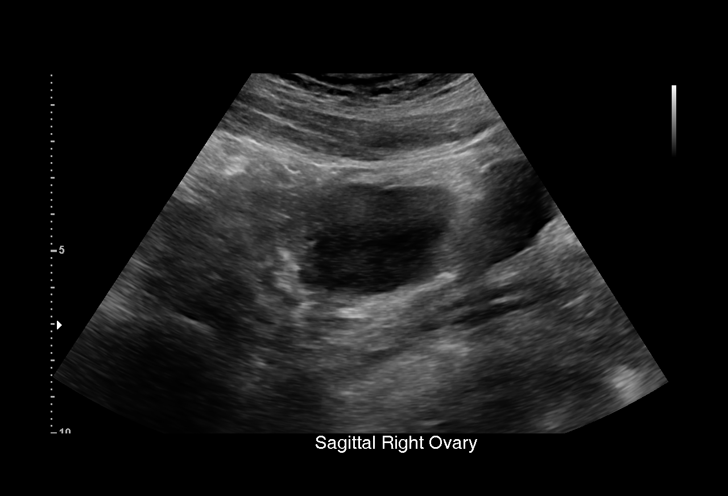
[im 27/47]
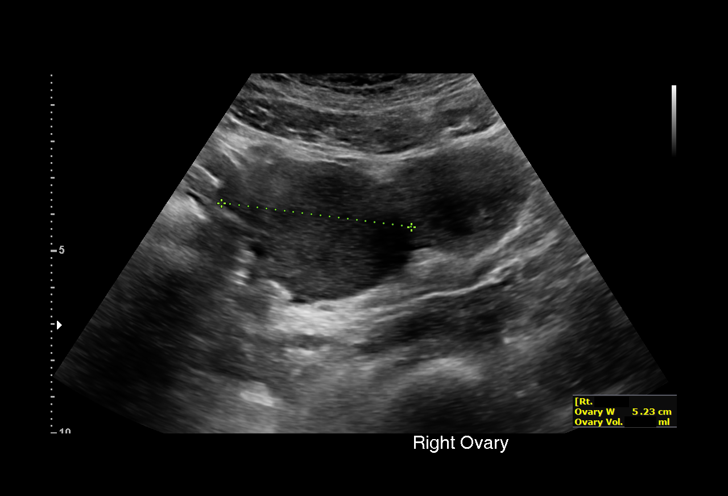
[im 29/47]
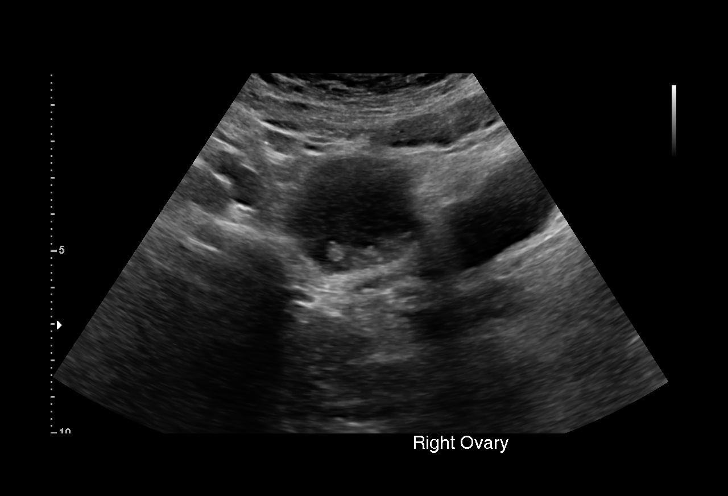
[im 33/47]
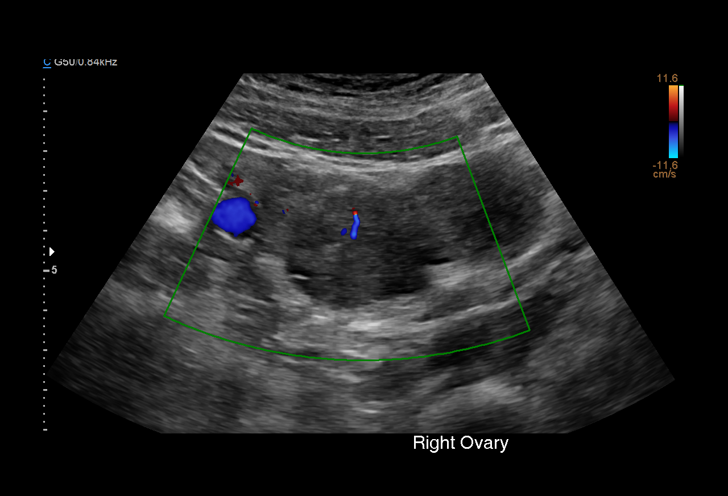
[im 37/47]
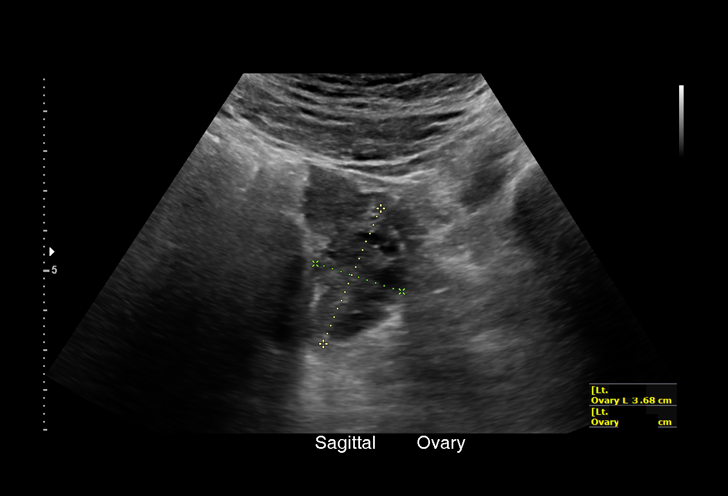
[im 39/47]
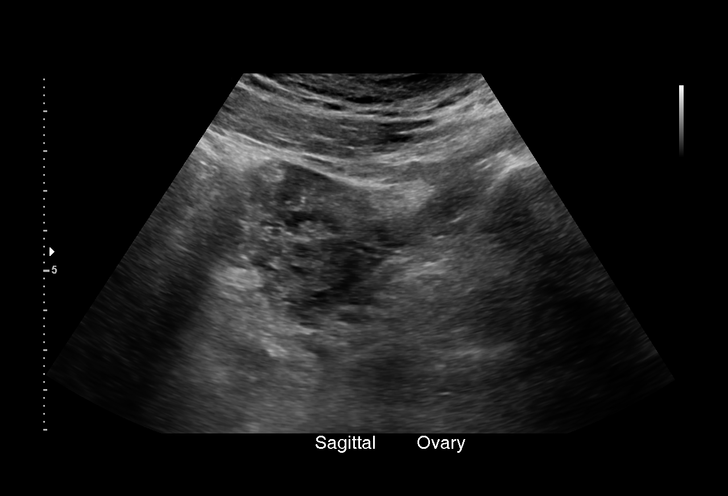
[im 43/47]
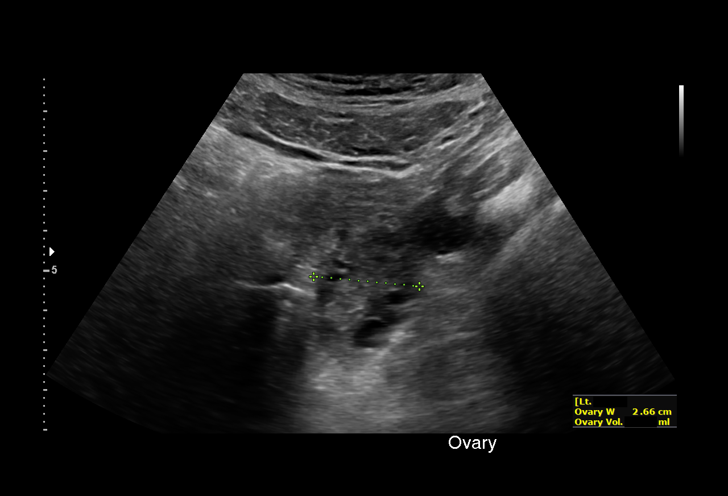
[im 47/47]
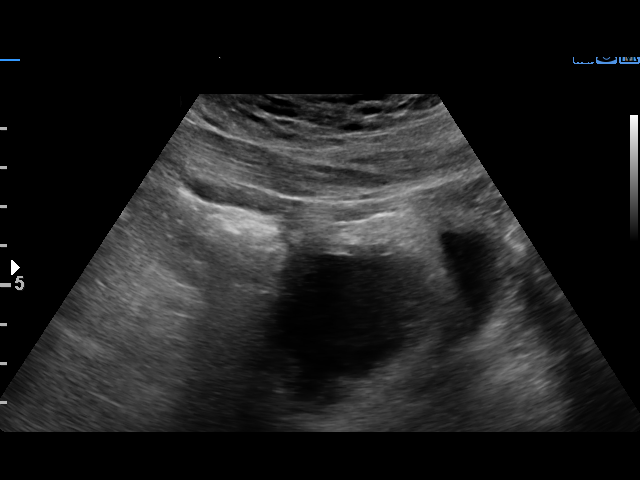

[15 of 25 positions shown; findings below may reference images not displayed]

FINDINGS: Uterus

Measurements: 6.6 x 3.5 x 4.4 cm. No fibroids or other mass
visualized.

Endometrium

Thickness: 9 mm in thickness.  No focal abnormality visualized.

Right ovary

Measurements: 5.3 x 5.0 x 5.2 cm complex hypoechoic area within the
right ovary a again noted measuring 4.6 x 4.1 x 3.8 cm compared to
5.0 x 4.6 x 4.5 cm previously.. Normal appearance/no adnexal mass.

Left ovary

Measurements: 3.7 x 2.3 x 2.7 cm. Normal appearance/no adnexal mass.

Other findings:  No abnormal free fluid.
IMPRESSION: Hypoechoic mass within the right ovary is again noted, slightly
smaller in size, now measuring 4.6 x 4.1 x 3.8 cm. This most likely
reflects hemorrhagic cyst or endometrioma. Continued follow-up is
recommended.

## 2021-12-26 ENCOUNTER — Encounter (HOSPITAL_BASED_OUTPATIENT_CLINIC_OR_DEPARTMENT_OTHER): Payer: Self-pay | Admitting: Urology

## 2021-12-26 ENCOUNTER — Other Ambulatory Visit: Payer: Self-pay

## 2021-12-26 ENCOUNTER — Emergency Department (HOSPITAL_BASED_OUTPATIENT_CLINIC_OR_DEPARTMENT_OTHER): Payer: Self-pay

## 2021-12-26 ENCOUNTER — Emergency Department (HOSPITAL_BASED_OUTPATIENT_CLINIC_OR_DEPARTMENT_OTHER)
Admission: EM | Admit: 2021-12-26 | Discharge: 2021-12-26 | Disposition: A | Discharge status: Home / Self Care | Payer: Self-pay | Attending: Emergency Medicine | Admitting: Emergency Medicine

## 2021-12-26 DIAGNOSIS — W010XXA Fall on same level from slipping, tripping and stumbling without subsequent striking against object, initial encounter: Secondary | ICD-10-CM | POA: Insufficient documentation

## 2021-12-26 DIAGNOSIS — S90111A Contusion of right great toe without damage to nail, initial encounter: Secondary | ICD-10-CM | POA: Insufficient documentation

## 2021-12-26 MED ORDER — NAPROXEN 500 MG PO TABS: 500.0000 mg | ORAL_TABLET | Freq: Two times a day (BID) | ORAL | 0 refills | Status: AC | PRN

## 2021-12-26 NOTE — Discharge Instructions (Signed)
Your x-ray is negative.  Keep leg elevated and take the anti-inflammatories as prescribed.  Follow-up with your doctor.  Return to the ED with new or worsening symptoms.

## 2021-12-26 NOTE — ED Triage Notes (Signed)
Right great toe injury last Monday when tripping over dog  Mild swelling per pt

## 2021-12-26 NOTE — ED Provider Notes (Signed)
[facility] EMERGENCY DEPARTMENT Provider Note   CSN: [id number] Arrival date & time: 12/26/21  1832     History  Chief Complaint  Patient presents with   Toe Injury    Janet Navarro is a 31 y.o. female.  Right great toe pain for the past 8 days after tripping over a dog.  D did not hit head when she fell.  Complains of pain to right great toe and dorsal foot.  Taking ibuprofen without relief.  No weakness numbness or tingling.  No breaks in the skin or wounds.  The history is provided by the patient.       Home Medications Prior to Admission medications   Medication Sig Start Date End Date Taking? Authorizing Provider  naproxen (NAPROSYN) 500 MG tablet Take 1 tablet (500 mg total) by mouth 2 (two) times daily as needed. 12/26/21  Yes Titus Drone, Jeannett Senior, MD  CRYSELLE-28 0.3-30 MG-MCG tablet TAKE 1 TABLET BY MOUTH EVERY DAY 11/28/16   Nicholaus Bloom C, MD  ibuprofen (ADVIL,MOTRIN) 800 MG tablet Take 1 tablet (800 mg total) by mouth every 8 (eight) hours as needed. 08/28/16   Willodean Rosenthal, MD      Allergies    Fish allergy    Review of Systems   Review of Systems  Constitutional:  Negative for activity change, appetite change and fever.  HENT:  Negative for congestion and rhinorrhea.   Respiratory:  Negative for cough and chest tightness.   Cardiovascular:  Negative for chest pain.  Gastrointestinal:  Negative for abdominal pain, nausea and vomiting.  Musculoskeletal:  Positive for arthralgias and myalgias.  Neurological:  Negative for weakness and headaches.   all other systems are negative except as noted in the HPI and PMH.    Physical Exam Updated Vital Signs BP (!) 142/97 (BP Location: Left Arm)   Pulse 85   Temp 98.7 F (37.1 C)   Resp 18   Ht 5\' 7"  (1.702 m)   Wt 96.6 kg   LMP 12/09/2021   SpO2 97%   BMI 33.36 kg/m  Physical Exam Vitals and nursing note reviewed.  Constitutional:      General: She is not in acute distress.     Appearance: She is well-developed.  HENT:     Head: Normocephalic and atraumatic.     Mouth/Throat:     Pharynx: No oropharyngeal exudate.  Eyes:     Conjunctiva/sclera: Conjunctivae normal.     Pupils: Pupils are equal, round, and reactive to light.  Neck:     Comments: No meningismus. Cardiovascular:     Rate and Rhythm: Normal rate and regular rhythm.     Heart sounds: Normal heart sounds. No murmur heard. Pulmonary:     Effort: Pulmonary effort is normal. No respiratory distress.     Breath sounds: Normal breath sounds.  Abdominal:     Palpations: Abdomen is soft.     Tenderness: There is no abdominal tenderness. There is no guarding or rebound.  Musculoskeletal:        General: Tenderness present. Normal range of motion.     Cervical back: Normal range of motion and neck supple.     Comments: Tenderness to the right first MTP joint and dorsal foot.  Intact distal pulses.  No erythema or edema.  Skin:    General: Skin is warm.  Neurological:     Mental Status: She is alert and oriented to person, place, and time.     Cranial Nerves: No cranial  nerve deficit.     Motor: No abnormal muscle tone.     Coordination: Coordination normal.     Comments:  5/5 strength throughout. CN 2-12 intact.Equal grip strength.   Psychiatric:        Behavior: Behavior normal.     ED Results / Procedures / Treatments   Labs (all labs ordered are listed, but only abnormal results are displayed) Labs Reviewed - No data to display  EKG None  Radiology DG Toe Great Right  Result Date: 12/26/2021 CLINICAL DATA:  Toe injury 1 week ago. EXAM: RIGHT GREAT TOE COMPARISON:  None Available. FINDINGS: There is no evidence of fracture or dislocation. Normal alignment and joint spaces. There is no evidence of arthropathy or other focal bone abnormality. Soft tissues are unremarkable. IMPRESSION: Negative radiographs of the right great toe. Electronically Signed   By: Narda Rutherford M.D.   On:  12/26/2021 19:06    Procedures Procedures    Medications Ordered in ED Medications - No data to display  ED Course/ Medical Decision Making/ A&P                           Medical Decision Making Amount and/or Complexity of Data Reviewed Radiology: ordered.  Risk Prescription drug management.  Right toe contusion.  Neurovascular intact.  X-rays in triage is negative.  Results reviewed interpreted by me.  Hard soled shoe, NSAIDs, elevation.  PCP follow-up.        Final Clinical Impression(s) / ED Diagnoses Final diagnoses:  Contusion of right great toe without damage to nail, initial encounter    Rx / DC Orders ED Discharge Orders          Ordered    naproxen (NAPROSYN) 500 MG tablet  2 times daily PRN        12/26/21 2227              Glynn Octave, MD 12/26/21 2229
